# Patient Record
Sex: Female | Born: 1955 | Race: White | Hispanic: No | Marital: Married | Smoking: Former smoker
Health system: Southern US, Community
[De-identification: ages and names within clinical notes are randomized; demographics above are authoritative.]

## PROBLEM LIST (undated history)

## (undated) DIAGNOSIS — F419 Anxiety disorder, unspecified: Secondary | ICD-10-CM

## (undated) DIAGNOSIS — E039 Hypothyroidism, unspecified: Secondary | ICD-10-CM

## (undated) DIAGNOSIS — G43909 Migraine, unspecified, not intractable, without status migrainosus: Secondary | ICD-10-CM

## (undated) DIAGNOSIS — T4145XA Adverse effect of unspecified anesthetic, initial encounter: Secondary | ICD-10-CM

## (undated) DIAGNOSIS — B059 Measles without complication: Secondary | ICD-10-CM

## (undated) DIAGNOSIS — B019 Varicella without complication: Secondary | ICD-10-CM

## (undated) DIAGNOSIS — T8859XA Other complications of anesthesia, initial encounter: Secondary | ICD-10-CM

## (undated) DIAGNOSIS — Z9889 Other specified postprocedural states: Secondary | ICD-10-CM

## (undated) DIAGNOSIS — R112 Nausea with vomiting, unspecified: Secondary | ICD-10-CM

## (undated) HISTORY — PX: ABDOMINAL HYSTERECTOMY: SHX81

## (undated) HISTORY — PX: TONSILLECTOMY: SUR1361

## (undated) HISTORY — DX: Hypothyroidism, unspecified: E03.9

## (undated) HISTORY — DX: Anxiety disorder, unspecified: F41.9

---
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
## 2000-09-15 ENCOUNTER — Ambulatory Visit (HOSPITAL_COMMUNITY): Admission: RE | Admit: 2000-09-15 | Discharge: 2000-09-15 | Payer: Self-pay | Admitting: General Surgery

## 2001-03-07 ENCOUNTER — Encounter: Payer: Self-pay | Admitting: Obstetrics and Gynecology

## 2001-03-07 ENCOUNTER — Ambulatory Visit (HOSPITAL_COMMUNITY): Admission: RE | Admit: 2001-03-07 | Discharge: 2001-03-07 | Payer: Self-pay | Admitting: Obstetrics and Gynecology

## 2001-03-16 ENCOUNTER — Other Ambulatory Visit: Admission: RE | Admit: 2001-03-16 | Discharge: 2001-03-16 | Payer: Self-pay | Admitting: Obstetrics and Gynecology

## 2001-11-01 ENCOUNTER — Ambulatory Visit (HOSPITAL_COMMUNITY): Admission: RE | Admit: 2001-11-01 | Discharge: 2001-11-01 | Payer: Self-pay | Admitting: Internal Medicine

## 2002-08-12 ENCOUNTER — Inpatient Hospital Stay (HOSPITAL_COMMUNITY): Admission: RE | Admit: 2002-08-12 | Discharge: 2002-08-16 | Payer: Self-pay | Admitting: Obstetrics and Gynecology

## 2002-08-15 ENCOUNTER — Encounter: Payer: Self-pay | Admitting: Obstetrics and Gynecology

## 2002-08-19 ENCOUNTER — Encounter: Payer: Self-pay | Admitting: Obstetrics and Gynecology

## 2002-08-19 ENCOUNTER — Ambulatory Visit (HOSPITAL_COMMUNITY): Admission: RE | Admit: 2002-08-19 | Discharge: 2002-08-19 | Payer: Self-pay | Admitting: Obstetrics and Gynecology

## 2004-06-28 ENCOUNTER — Ambulatory Visit (HOSPITAL_COMMUNITY): Admission: RE | Admit: 2004-06-28 | Discharge: 2004-06-28 | Payer: Self-pay | Admitting: Obstetrics and Gynecology

## 2008-06-02 ENCOUNTER — Ambulatory Visit (HOSPITAL_COMMUNITY): Admission: RE | Admit: 2008-06-02 | Discharge: 2008-06-02 | Payer: Self-pay | Admitting: Obstetrics & Gynecology

## 2009-05-04 ENCOUNTER — Ambulatory Visit (HOSPITAL_COMMUNITY): Admission: RE | Admit: 2009-05-04 | Discharge: 2009-05-04 | Payer: Self-pay | Admitting: Obstetrics and Gynecology

## 2010-10-22 NOTE — Discharge Summary (Signed)
   NAME:  Lisa Fernandez, Lisa Fernandez                        ACCOUNT NO.:  [id number]   MEDICAL RECORD NO.:  [id number]                   PATIENT TYPE:  INP   LOCATION:  A427                                 FACILITY:  [facility]   PHYSICIAN:  Tilda Burrow, M.D.              DATE OF BIRTH:  1955/08/31   DATE OF ADMISSION:  08/12/2002  DATE OF DISCHARGE:  08/16/2002                                 DISCHARGE SUMMARY   ADMISSION DIAGNOSES:  1. Menorrhagia.  2. Recurrent, persistent ovarian cysts.   DISCHARGE DIAGNOSES:  1. Menorrhagia.  2. Recurrent, persistent ovarian cysts.   PROCEDURE:  Total abdominal hysterectomy and bilateral salpingo-  oophorectomy, 08/12/2002.   DISCHARGE MEDICATIONS:  1. Estratest one p.o. daily.  2. HCTZ 25 mg one p.o. daily.  3. K-Dur 20 mEq p.o. b.i.d. times two weeks.  4. Darvocet-N 100 one to two q.4h. p.r.n. pain.   HOSPITAL COURSE:  Patient was admitted and underwent hysterectomy in what  was considered an uneventful fashion on 08/12/2002.  Urinary output during  the evening of surgery was lower than anticipated, and patient required  fluid bolus on several occasions.  The patient had low blood pressures, was  seen by me at 1:50 a.m.  I did an ultrasound, and this revealed that she had  some suspected continued intra-abdominal bleeding.   Dictation ended at this point.                                               Tilda Burrow, M.D.    JVF/MEDQ  D:  10/14/2002  T:  10/15/2002  Job:  [id number]

## 2010-10-22 NOTE — Discharge Summary (Signed)
NAME:  Lisa Fernandez, Lisa Fernandez                        ACCOUNT NO.:  [id number]   MEDICAL RECORD NO.:  [id number]                   PATIENT TYPE:  INP   LOCATION:  A427                                 FACILITY:  [facility]   PHYSICIAN:  Tilda Burrow, M.D.              DATE OF BIRTH:  11-19-1955   DATE OF ADMISSION:  08/12/2002  DATE OF DISCHARGE:  08/16/2002                                 DISCHARGE SUMMARY   ADDENDUM:  As described earlier, before the tape cut off, we were evaluating  her for postoperative hypovolemia, and she was suspected to have an intra-  abdominal hematoma developing.  She was taken back to the OR at 5 a.m. on  08/13/2002, which revealed a postoperative seroma in the subcu tissues.  She  was re-explored that morning with findings consisting of 300 to 500 mL of  transudate in pelvis with generous small-bowel fluid; no evidence of tissue  injury.  She had 800 mL of bilious nasogastric thin drainage and a smooth  pelvic _________ without evidence of postoperative bleeding.  She had a  subcu J-P drain placed and nasogastric tube placed to intermittent suction.  Postoperatively, the patient had hypoactive bowel sounds, and, upon talking  to her further, she was taking lactulose at home the night after the  surgery.  She has a history of chronic laxative use.  It was felt that the  hypovolemia and decreased urinary output postoperatively were due to the  lactulose and its third spacing into the bowel, since lactulose acts as an  osmotic diuretic.  From that point, she had a more uneventful postoperative  course.  The hemoglobin was 9.5, hematocrit 27.6, white count normal at  6900.  She did well from 08/13/2002.  On 08/14/2002, she had a watery bowel  movement response to lactulose.  Temperature was 101, but she only had some  mild hypoactive bowel sounds postoperatively.  Third spacing continued.  On  08/15/2002, she had some decreased oxygen saturation overnight but  responded  to oxygen ________ and had deep inspiratory rales bilaterally.  She was  considered to have fluid overload and was treated with a diuretic.  On  08/15/2002, by that evening, she looked much better, and chest x-ray showed  right lower lobe infiltrate, suggesting hypoventilation.  She was given p.o.  Lasix and did well.  On 08/16/2002, postoperative day four from her original  surgery and postoperative day three from her exploration, she had lost 6  pounds, looked dramatically better, and was considered stable for discharge.   DISCHARGE DIAGNOSIS:  Chronic laxative use.                                               Tilda Burrow, M.D.    JVF/MEDQ  D:  10/14/2002  T:  10/15/2002  Job:  [id number]

## 2010-10-22 NOTE — Op Note (Signed)
   NAME:  Lisa Fernandez, Lisa Fernandez                        ACCOUNT NO.:  [id number]   MEDICAL RECORD NO.:  [id number]                   PATIENT TYPE:  PINP   LOCATION:                                       FACILITY:  [facility]   PHYSICIAN:  Tilda Burrow, M.D.              DATE OF BIRTH:  Dec 10, 1955   DATE OF PROCEDURE:  08/12/2002  DATE OF DISCHARGE:                                 OPERATIVE REPORT   PREOPERATIVE DIAGNOSIS:  Menorrhagia.   POSTOPERATIVE DIAGNOSIS:  Menorrhagia.   PROCEDURE:  Total abdominal hysterectomy with bilateral salpingo-  oophorectomy.   SURGEON:  Tilda Burrow, M.D.   ASSISTANT:  ___________.   ANESTHESIA:  General.   COMPLICATIONS:  None.   ESTIMATED BLOOD LOSS:  400 cc.   DESCRIPTION OF PROCEDURE:  The patient was taken to the operating room and  prepped and draped in the usual fashion for lower abdominal surgery.   The transverse lower abdominal incision was repeated, excising the old scar  tissue.  The abdomen was prepped and the bowel packed away.  The uterus was  grasped, elevated, and the round ligaments taken down on either side.  The  infundibulopelvic ligaments on either side were clamped, cut, and suture  ligated.  The uterine vessels were then skeletonized, then crossclamped with  curved Heaney clamps, transected with Kelly clamps in position to stop  backbleeding.  We then proceeded with 0 chromic suture ligature on each  side.  The bladder flap had been developed anteriorly.  We then proceeded  with taken down the uterine support structures, first the upper cardinal  ligaments using the straight Heaney clamp, knife dissection, and 0 chromic  suture ligature.  The uterine vessels had been clamped, cut, and suture  ligated previously.  The lower cardinal ligaments were then taken down using  clamping, cutting, and suture ligating these with 0 chromic.  The anterior  cervicovaginal fornix was then entered with a knife stab incision followed  by  circumferential amputation of the cervix off of the vaginal cuff.  Two  Aldridge stitches were placed at each vaginal angle to improve vaginal cuff  support and promote hemostasis.  The cuff was then closed using a series of  interrupted 0 chromic suture ligatures, with good tissue approximation.  The  patient tolerated the procedure well and went to the recovery room in good  condition.                                               Tilda Burrow, M.D.   JVF/MEDQ  D:  10/14/2002  T:  10/15/2002  Job:  [id number]

## 2010-10-22 NOTE — Op Note (Signed)
NAME:  Lisa Fernandez, Lisa Fernandez                        ACCOUNT NO.:  [id number]   MEDICAL RECORD NO.:  [id number]                   PATIENT TYPE:  INP   LOCATION:  A427                                 FACILITY:  [facility]   PHYSICIAN:  Tilda Burrow, M.D.              DATE OF BIRTH:  1956-03-11   DATE OF PROCEDURE:  08/12/2002  DATE OF DISCHARGE:                                 OPERATIVE REPORT   PREOPERATIVE DIAGNOSIS:  Menorrhagia.   POSTOPERATIVE DIAGNOSES:  Menorrhagia.   PROCEDURE:  Total abdominal hysterectomy and bilateral salpingo-  oophorectomy, wide excision.   SURGEON:  Tilda Burrow, M.D.   ASSISTANT:  __________.   ANESTHESIA:  General.   COMPLICATIONS:  None.   FINDINGS:  Extensive fibrosis and healed C-section scars, markedly  retroverted twice normal size uterus--estimated 140 g.  Relatively normal  ovaries with the exception of a small 4-cm cyst on the left ovary.   DESCRIPTION OF PROCEDURE:  The patient was taken to the operating room,  prepped and draped in the usual fashion for lower abdominal surgery with  vaginal prepping and Foley catheter in place.  The old cicatrix was excised  widely, removing a 20 cm long x 6 cm wide ellipse skin and underlying  fibrotic tissues down to the fascia, which was then opened transversally in  the method of Pfannenstiel.  The peritoneum was carefully opened, bowel  visibly normal to appearance, and bowel able to be elevated and packed away.  Balfour retractor was in place and we made particular effort to make sure  that the blades did not reach the level of the pelvic vessels or iliopsoas  muscles.  A dry gauze laparotomy tape was placed underneath the retractor  elevated above the skin, so that no pressure could occur.   We then proceeded to identify round ligaments on either side doubly  clamping, cutting, and suture ligating them and followed by isolation of the  infundibulopelvic ligaments on either side, confirming the  ureters as being  well out of harms way and then doubly clamping, cutting, and suture ligating  the IP ligaments.  The uterine vessels were then skeletonized on each side,  clamped with curved Heaney clamp, and transected and suture ligated.  The  upper and lower cardinal ligaments were similarly taken down and back to the  straight Heaney clamps, knife dissection, and 0 chromic suture ligature.  Upon reaching the level of the vaginal cuff, we then opened the cuff  anteriorly and circumscribed and amputated the cervix off the vaginal cuff.  Four Kocher clamps were used to hold the vaginal angle.  __________  stitch  was placed in each lateral vaginal angle.   The cuff was then closed in the midline with a series of interrupted figure-  of-eight sutures with good hemostasis achieved.  The patient then had  irrigation of the pelvis, point cautery as necessary to achieve adequate  hemostasis, and then loose oversewing of the pelvic cuff pulling the pelvic  peritoneum over it with good, smooth, nonstressed tension-free pelvic floor  obtained.  Laparotomy equipment was removed, pelvis having been irrigated  and found to have satisfactory hemostasis, and then the anterior peritoneum  closed using 2-0 chromic.  The fascia was closed using continuous 0 Vicryl  after trimming the upper edge of the fascia to make it even and remove the  band of fibrosis that was there on the edge.  We then closed the fascia as  described earlier, closed the subcutaneous tissues with interrupted 2-0  plain, and then placed a subcutaneous drain flat Jackson-Pratt 10-mm size in  the subcutaneous space and then placed staples in the skin with 300 mL  estimated blood loss.                                               Tilda Burrow, M.D.    JVF/MEDQ  D:  08/12/2002  T:  08/12/2002  Job:  [id number]

## 2010-10-22 NOTE — H&P (Signed)
NAME:  Lisa Fernandez, Lisa Fernandez NO.:  [id number]   MEDICAL RECORD NO.:  [id number]                   PATIENT TYPE:  INP   LOCATION:                                       FACILITY:  [facility]   PHYSICIAN:  Tilda Burrow, M.D.              DATE OF BIRTH:  02/03/1956   DATE OF ADMISSION:  08/12/2002  DATE OF DISCHARGE:                                HISTORY & PHYSICAL   ADMISSION DIAGNOSES:  1. Menorrhagia, failed medical therapy.  2. Recurrent, persistent ovarian cyst.   HISTORY OF PRESENT ILLNESS:  This 55 year old female, gravida 3, para 3,  status post three cesarean sections, with LMP 07/31/2002, is admitted at this  time for abdominal hysterectomy and bilateral salpingo-oophorectomy for  resolution of heavy menses which have not responded to efforts with birth  control pills (Mircette) as well as CombiPatch.  Ultrasound has shown her to  have a retroverted uterus, upper limits of normal size, with recurrent,  persistent ovarian cyst which has been symptomatic.  She had symptomatic  cysts 5.9 x 4.5 x 4.5 cm in the left ovary in November. Suppression with  Mircette resulted in slight improvement with maximum ovarian size 4.2 cm  post suppression.  The patient unfortunately has had persistent irregular  bleeding.  The ultrasound previously mentioned showed a normal uterus with  thin endometrial stripe but nonetheless, she failed to respond.  The patient  is sick and tired of the bleeding and desires surgical removal of the  uterus.  When not on birth control pills, she has had bleeding requiring  both tampons and pads with tampon change every 20 minutes generally on heavy  days of her cycle.   D&C was performed in 1995 for almost the exact same symptoms and only  resulted in temporary improvement.  The patient now wishes surgical removal  of the uterus.  After discussion of pros and cons with removal of tubes and  ovaries, she wishes to have removal of both  tubes and ovaries.  She has no  desire for persistent concerns over cysts and would rather take hormone  therapy if necessary.  The pros and cons of hormone therapy including  mention of the questionable danger and risks of breast cancer have been  discussed.   PAST MEDICAL HISTORY:  1. Positive for mild anxiety disorder.  2. Irritable bowel syndrome.  3. Degenerative disk with occasional discomfort in the buttocks right and     left  and right leg.   PAST SURGICAL HISTORY:  1. C section x 2.  2. Laparotomy in 1983.   HABITS:  Cigarettes and alcohol denied.   ALLERGIES:  TYLOX causes itching.   PHYSICAL EXAMINATION:  VITAL SIGNS:  Height 5 feet 7 inches, weight 189.  Blood pressure 125/70.  GENERAL:  Healthy-appearing Caucasian female, alert and oriented x 3.  HEENT:  Pupils are equal, round, and reactive.  Extraocular movements  intact.  NECK:  Supple.  Trachea midline.  CHEST: Clear to auscultation.  ABDOMEN:  Fibrotic, retracted Pfannenstiel type incision from three prior  surgeries.  Will require wide excision of this fibrotic scar as a part of  the procedure.  The patient is aware the incision will be much wider in  order to have cosmetically acceptable results.  PELVIC:  External genitalia normal.  Multiparous cervix.  Uterus  retroflexed, retroverted, having 20 g estimated weight.  Adnexa had small  cyst on the left at 4.2 cm in size.   PLAN:  Hysterectomy with removal of tubes and ovaries and wide excision of  cicatrix on 08/12/2002.                                               Tilda Burrow, M.D.    JVF/MEDQ  D:  08/09/2002  T:  08/09/2002  Job:  [id number]

## 2011-03-24 ENCOUNTER — Other Ambulatory Visit: Payer: Self-pay | Admitting: Obstetrics and Gynecology

## 2011-03-24 DIAGNOSIS — Z139 Encounter for screening, unspecified: Secondary | ICD-10-CM

## 2011-04-01 ENCOUNTER — Ambulatory Visit (HOSPITAL_COMMUNITY)
Admission: RE | Admit: 2011-04-01 | Discharge: 2011-04-01 | Disposition: A | Discharge status: Home / Self Care | Payer: BC Managed Care – PPO | Source: Ambulatory Visit | Attending: Obstetrics and Gynecology | Admitting: Obstetrics and Gynecology

## 2011-04-01 DIAGNOSIS — Z1231 Encounter for screening mammogram for malignant neoplasm of breast: Secondary | ICD-10-CM | POA: Insufficient documentation

## 2011-04-01 DIAGNOSIS — Z139 Encounter for screening, unspecified: Secondary | ICD-10-CM

## 2012-03-21 ENCOUNTER — Other Ambulatory Visit: Payer: Self-pay | Admitting: Obstetrics and Gynecology

## 2012-03-21 DIAGNOSIS — Z139 Encounter for screening, unspecified: Secondary | ICD-10-CM

## 2012-04-09 ENCOUNTER — Ambulatory Visit (HOSPITAL_COMMUNITY)
Admission: RE | Admit: 2012-04-09 | Discharge: 2012-04-09 | Disposition: A | Discharge status: Home / Self Care | Payer: BC Managed Care – PPO | Source: Ambulatory Visit | Attending: Obstetrics and Gynecology | Admitting: Obstetrics and Gynecology

## 2012-04-09 DIAGNOSIS — Z139 Encounter for screening, unspecified: Secondary | ICD-10-CM

## 2012-04-09 DIAGNOSIS — Z1231 Encounter for screening mammogram for malignant neoplasm of breast: Secondary | ICD-10-CM | POA: Insufficient documentation

## 2013-01-08 ENCOUNTER — Other Ambulatory Visit: Payer: Self-pay | Admitting: Obstetrics and Gynecology

## 2013-01-08 NOTE — Telephone Encounter (Signed)
Left message. JSY 

## 2013-01-09 NOTE — Telephone Encounter (Signed)
I authorized 6 months refil this moring at 4 am, didn't call pt ,of course due to time of day. plese advise pt she will need a brief visit for refil in 6 months

## 2013-01-09 NOTE — Telephone Encounter (Signed)
Xanax Rx put in EPIC and routed to Dr. Emelda Fear to address. Pt aware. To call back this afternoon if she hasn't heard from [place].

## 2013-01-09 NOTE — Telephone Encounter (Signed)
Spoke with pt. Advised Xanax had been refilled per Dr. Emelda Fear. Advised to schedule a visit within 6 months. Pt voiced understanding. JSY

## 2013-04-19 ENCOUNTER — Other Ambulatory Visit: Payer: Self-pay | Admitting: Obstetrics and Gynecology

## 2013-04-19 DIAGNOSIS — Z139 Encounter for screening, unspecified: Secondary | ICD-10-CM

## 2013-05-13 ENCOUNTER — Ambulatory Visit (HOSPITAL_COMMUNITY)
Admission: RE | Admit: 2013-05-13 | Discharge: 2013-05-13 | Disposition: A | Discharge status: Home / Self Care | Payer: BC Managed Care – PPO | Source: Ambulatory Visit | Attending: Obstetrics and Gynecology | Admitting: Obstetrics and Gynecology

## 2013-05-13 DIAGNOSIS — Z1231 Encounter for screening mammogram for malignant neoplasm of breast: Secondary | ICD-10-CM | POA: Insufficient documentation

## 2013-05-13 DIAGNOSIS — Z139 Encounter for screening, unspecified: Secondary | ICD-10-CM

## 2013-05-15 ENCOUNTER — Encounter: Payer: Self-pay | Admitting: Obstetrics and Gynecology

## 2013-05-15 ENCOUNTER — Ambulatory Visit (INDEPENDENT_AMBULATORY_CARE_PROVIDER_SITE_OTHER): Payer: BC Managed Care – PPO | Admitting: Obstetrics and Gynecology

## 2013-05-15 VITALS — BP 110/66 | Ht 67.0 in | Wt 183.0 lb

## 2013-05-15 DIAGNOSIS — Z01419 Encounter for gynecological examination (general) (routine) without abnormal findings: Secondary | ICD-10-CM

## 2013-05-15 DIAGNOSIS — Z0000 Encounter for general adult medical examination without abnormal findings: Secondary | ICD-10-CM

## 2013-05-15 MED ORDER — ALPRAZOLAM 0.5 MG PO TABS: 0.5000 mg | ORAL_TABLET | Freq: Two times a day (BID) | ORAL | Status: DC | PRN

## 2013-05-15 MED ORDER — ESTROGENS, CONJUGATED 0.625 MG/GM VA CREA: 1.0000 | TOPICAL_CREAM | VAGINAL | Status: DC

## 2013-05-15 NOTE — Progress Notes (Signed)
Patient ID: Lisa Fernandez, female   DOB: 01-06-1956, 57 y.o.   MRN: [id number]  Assessment:  Annual Gyn Exam   Plan:  No pap.  3    Annual mammogram advised Subjective:  Lisa Fernandez is a 57 y.o. female No obstetric history on file. who presents for annual exam. No LMP recorded. Patient has had a hysterectomy. The patient has complaints today of none   The following portions of the patient's history were reviewed and updated as appropriate: allergies, current medications, past family history, past medical history, past social history, past surgical history and problem list.  Review of Systems Constitutional: negative Gastrointestinal: negative Genitourinary:   Objective:  BP 110/66  Ht 5\' 7"  (1.702 m)  Wt 183 lb (83.008 kg)  BMI 28.66 kg/m2   BMI: Body mass index is 28.66 kg/(m^2).  General Appearance: Alert, appropriate appearance for age. No acute distress HEENT: Grossly normal Neck / Thyroid:  Cardiovascular: RRR; normal S1, S2, no murmur Lungs: CTA bilaterally Back: No CVAT Breast Exam: No dimpling, nipple retraction or discharge. No masses or nodes. and No masses or nodes.No dimpling, nipple retraction or discharge. Gastrointestinal: Soft, non-tender, no masses or organomegaly Pelvic Exam: Vulva and vagina appear normal. Bimanual exam reveals normal uterus and adnexa. Vaginal: normal mucosa without prolapse or lesions Adnexa: normal bimanual exam Uterus: absent and removed surgically Rectal: good sphincter tone Rectovaginal: normal rectal, no masses Lymphatic Exam: Non-palpable nodes in neck, clavicular, axillary, or inguinal regions Skin: no rash or abnormalities Neurologic: Normal gait and speech, no tremor  Psychiatric: Alert and oriented, appropriate affect.  Urinalysis:normal and Not done  Christin Bach. MD Pgr [phone] 3:56 PM

## 2013-12-04 ENCOUNTER — Other Ambulatory Visit: Payer: Self-pay | Admitting: Obstetrics and Gynecology

## 2013-12-05 ENCOUNTER — Telehealth: Payer: Self-pay | Admitting: Obstetrics and Gynecology

## 2013-12-05 ENCOUNTER — Other Ambulatory Visit: Payer: Self-pay | Admitting: *Deleted

## 2013-12-05 MED ORDER — ALPRAZOLAM 0.5 MG PO TABS: 0.5000 mg | ORAL_TABLET | Freq: Two times a day (BID) | ORAL | Status: DC | PRN

## 2013-12-05 NOTE — Telephone Encounter (Signed)
Spoke with pt letting her know we haven't received anything from [place]. Pt to call pharmacy and have them send a refill request for Xanax. JSY      We did receive a refill request from [facility] for Xanax and that will be entered into EPIC. [place]

## 2014-04-01 ENCOUNTER — Other Ambulatory Visit: Payer: Self-pay | Admitting: *Deleted

## 2014-04-01 MED ORDER — ALPRAZOLAM 0.5 MG PO TABS: 0.5000 mg | ORAL_TABLET | Freq: Two times a day (BID) | ORAL | Status: DC | PRN

## 2014-05-21 ENCOUNTER — Other Ambulatory Visit: Payer: Self-pay | Admitting: Obstetrics and Gynecology

## 2014-05-21 DIAGNOSIS — Z1231 Encounter for screening mammogram for malignant neoplasm of breast: Secondary | ICD-10-CM

## 2014-05-23 ENCOUNTER — Ambulatory Visit (INDEPENDENT_AMBULATORY_CARE_PROVIDER_SITE_OTHER): Payer: BC Managed Care – PPO | Admitting: Obstetrics and Gynecology

## 2014-05-23 ENCOUNTER — Encounter: Payer: Self-pay | Admitting: Obstetrics and Gynecology

## 2014-05-23 VITALS — BP 110/70 | Ht 68.0 in | Wt 190.0 lb

## 2014-05-23 DIAGNOSIS — Z1212 Encounter for screening for malignant neoplasm of rectum: Secondary | ICD-10-CM

## 2014-05-23 DIAGNOSIS — Z01419 Encounter for gynecological examination (general) (routine) without abnormal findings: Secondary | ICD-10-CM

## 2014-05-23 DIAGNOSIS — Z1211 Encounter for screening for malignant neoplasm of colon: Secondary | ICD-10-CM

## 2014-05-23 LAB — HEMOCCULT GUIAC POC 1CARD (OFFICE): FECAL OCCULT BLD: NEGATIVE

## 2014-05-23 NOTE — Progress Notes (Signed)
  Assessment:  Annual Gyn Exam   Plan:  1. pap smear done, next annual due yearly 2. return annually or prn 3    Annual mammogram advised Subjective:  Lisa Fernandez is a 58 y.o. female No obstetric history on file. who presents for annual exam. No LMP recorded. Patient has had a hysterectomy. She has not gotten her mammogram for the year yet because she has been working. She denies any associated symptoms. Since she has been placed on the synthroid medication she has been doing better. She had a colonoscopy completed three days ago. She just recently completed a hemoccult package.  The following portions of the patient's history were reviewed and updated as appropriate: allergies, current medications, past family history, past medical history, past social history, past surgical history and problem list. Past Medical History  Diagnosis Date  . Anxiety   . Hypothyroid     Past Surgical History  Procedure Laterality Date  . Abdominal hysterectomy    . Cesarean section      3    Current outpatient prescriptions: ALPRAZolam (XANAX) 0.5 MG tablet, Take 1 tablet (0.5 mg total) by mouth 2 (two) times daily as needed for anxiety., Disp: 60 tablet, Rfl: 3;  conjugated estrogens (PREMARIN) vaginal cream, Place 1 Applicatorful vaginally 2 (two) times a week., Disp: 42.5 g, Rfl: 2;  levothyroxine (SYNTHROID, LEVOTHROID) 50 MCG tablet, Take 50 mcg by mouth daily before breakfast., Disp: , Rfl:  rizatriptan (MAXALT) 5 MG tablet, Take 5 mg by mouth as needed for migraine. May repeat in 2 hours if needed, Disp: , Rfl:   Review of Systems Constitutional: negative Gastrointestinal: negative Genitourinary: negative  Objective:  BP 110/70 mmHg  Ht 5\' 8"  (1.727 m)  Wt 190 lb (86.183 kg)  BMI 28.90 kg/m2   BMI: Body mass index is 28.9 kg/(m^2).  General Appearance: Alert, appropriate appearance for age. No acute distress HEENT: Grossly normal Neck / Thyroid:  Cardiovascular: RRR; normal S1, S2, no  murmur Lungs: CTA bilaterally Back: No CVAT Breast Exam: No dimpling, nipple retraction or discharge. No masses or nodes., Normal to inspection and Normal breast tissue bilaterally, streak of tissue on left breast irregular nontender LUOQ tissue consistent with normal aging changes in tissue. Gastrointestinal: Soft, non-tender, no masses or organomegaly Pelvic Exam: External genitalia: normal general appearance Vaginal: normal mucosa without prolapse or lesions, normal without tenderness, induration or masses and normal rugae Cervix: removed surgically Adnexa: normal bimanual exam Uterus: removed surgically Rectovaginal: guaiac negative stool obtained Lymphatic Exam: Non-palpable nodes in neck, clavicular, axillary, or inguinal regions  Skin: no rash or abnormalities Neurologic: Normal gait and speech, no tremor  Psychiatric: Alert and oriented, appropriate affect.  Urinalysis:Not done  Assessment:  1. Annual Exam  Plan: 1. [facility] Rx refill  2 continue prn xanax lo dose, pt will notify us when rx needed   This chart was scribed for Lisa Kind, MD by Steva Colder, ED Scribe. The patient was seen in room 1 at 11:46 AM.

## 2014-06-02 ENCOUNTER — Ambulatory Visit (HOSPITAL_COMMUNITY)
Admission: RE | Admit: 2014-06-02 | Discharge: 2014-06-02 | Disposition: A | Discharge status: Home / Self Care | Payer: BC Managed Care – PPO | Source: Ambulatory Visit | Attending: Obstetrics and Gynecology | Admitting: Obstetrics and Gynecology

## 2014-06-02 DIAGNOSIS — Z1231 Encounter for screening mammogram for malignant neoplasm of breast: Secondary | ICD-10-CM | POA: Diagnosis present

## 2014-07-01 ENCOUNTER — Other Ambulatory Visit: Payer: Self-pay | Admitting: *Deleted

## 2014-07-03 NOTE — Telephone Encounter (Signed)
I think this came to me in error  She is a patient of Dr Johnnye Sima who just saw her last month so he can evaluate and decide whether to refill

## 2014-07-09 MED ORDER — ALPRAZOLAM 0.5 MG PO TABS: 0.5000 mg | ORAL_TABLET | Freq: Two times a day (BID) | ORAL | Status: DC | PRN

## 2014-07-09 NOTE — Addendum Note (Signed)
Addended by: [name] on: 07/09/2014 12:14 PM   Modules accepted: Orders

## 2014-11-21 ENCOUNTER — Other Ambulatory Visit: Payer: Self-pay | Admitting: Obstetrics and Gynecology

## 2014-11-24 ENCOUNTER — Other Ambulatory Visit: Payer: Self-pay | Admitting: *Deleted

## 2014-11-24 MED ORDER — ALPRAZOLAM 0.5 MG PO TABS: 0.5000 mg | ORAL_TABLET | Freq: Two times a day (BID) | ORAL | Status: DC | PRN

## 2014-11-26 ENCOUNTER — Telehealth: Payer: Self-pay | Admitting: Obstetrics and Gynecology

## 2014-11-26 ENCOUNTER — Other Ambulatory Visit: Payer: Self-pay | Admitting: Obstetrics and Gynecology

## 2014-11-26 DIAGNOSIS — F419 Anxiety disorder, unspecified: Secondary | ICD-10-CM

## 2014-11-26 MED ORDER — ALPRAZOLAM 0.5 MG PO TABS: 0.5000 mg | ORAL_TABLET | Freq: Two times a day (BID) | ORAL | Status: DC | PRN

## 2014-11-26 NOTE — Telephone Encounter (Signed)
I spoke with the pt and informed her that the Rx was approved and somehow didn't make it to the pharmacy. I advised the pt that I would get Dr. Glo Herring redo the Rx and to check back with the pharmacy after lunch. Pt verbalized understanding.

## 2015-01-14 ENCOUNTER — Other Ambulatory Visit: Payer: Self-pay | Admitting: Obstetrics and Gynecology

## 2015-01-20 ENCOUNTER — Telehealth: Payer: Self-pay | Admitting: *Deleted

## 2015-01-20 NOTE — Telephone Encounter (Signed)
Amy called from [facility] stating they did not receive pt RX for Xanax. Verbal order given for Xanax 0.5 mg tablet,  take one tablet po bid as needed for anxiety, #60, 2 refills per Epic.

## 2015-03-27 ENCOUNTER — Other Ambulatory Visit: Payer: Self-pay | Admitting: Obstetrics and Gynecology

## 2015-04-03 ENCOUNTER — Other Ambulatory Visit: Payer: Self-pay | Admitting: Obstetrics and Gynecology

## 2015-04-03 DIAGNOSIS — Z1231 Encounter for screening mammogram for malignant neoplasm of breast: Secondary | ICD-10-CM

## 2015-05-25 ENCOUNTER — Ambulatory Visit (INDEPENDENT_AMBULATORY_CARE_PROVIDER_SITE_OTHER): Payer: BLUE CROSS/BLUE SHIELD | Admitting: Obstetrics and Gynecology

## 2015-05-25 ENCOUNTER — Encounter: Payer: Self-pay | Admitting: Obstetrics and Gynecology

## 2015-05-25 VITALS — BP 112/74 | Ht 67.0 in | Wt 193.0 lb

## 2015-05-25 DIAGNOSIS — Z01419 Encounter for gynecological examination (general) (routine) without abnormal findings: Secondary | ICD-10-CM

## 2015-05-25 DIAGNOSIS — F419 Anxiety disorder, unspecified: Secondary | ICD-10-CM | POA: Insufficient documentation

## 2015-05-25 DIAGNOSIS — Z0000 Encounter for general adult medical examination without abnormal findings: Secondary | ICD-10-CM | POA: Insufficient documentation

## 2015-05-25 MED ORDER — ALPRAZOLAM 0.5 MG PO TABS: 0.5000 mg | ORAL_TABLET | Freq: Two times a day (BID) | ORAL | Status: DC | PRN

## 2015-05-25 NOTE — Progress Notes (Signed)
Patient ID: Lisa Fernandez, female   DOB: 10-Mar-1956, 59 y.o.   MRN: [id number] Pt here today for annual exam. Pt denies any problems or concerns at this time.

## 2015-05-25 NOTE — Progress Notes (Signed)
Patient ID: Lisa Fernandez, female   DOB: 09/19/1955, 59 y.o.   MRN: [id number]  Assessment:  Annual Gyn Exam   Plan:  1. pap smear not indicated s/p abdominal hysterectomy  2. return annually or prn 3    Annual mammogram advised Subjective:  Lisa Fernandez is a 59 y.o. female No obstetric history on file. who presents for annual exam. No LMP recorded. Patient has had a hysterectomy. The patient has no complaints. She reports her CBC, CMP and hemoccult from yesterday are normal. Pt has h/o abdominal hysterectomy, 3 cesarean sections. Pt reports she does self breast exams regularly and has not noticed any changes. FHx of breast cancer from grandmother. Pt has premarin that she uses intermittently for dyspareunia. She denies abdominal pain, constipation, diarrhea, melena, hematochezia.   The following portions of the patient's history were reviewed and updated as appropriate: allergies, current medications, past family history, past medical history, past social history, past surgical history and problem list. Past Medical History  Diagnosis Date  . Anxiety   . Hypothyroid     Past Surgical History  Procedure Laterality Date  . Abdominal hysterectomy    . Cesarean section      3     Current outpatient prescriptions:  .  ALPRAZolam (XANAX) 0.5 MG tablet, TAKE ONE TABLET BY MOUTH TWICE DAILY AS NEEDED FOR ANXIETY, Disp: 60 tablet, Rfl: 2 .  conjugated estrogens (PREMARIN) vaginal cream, Place 1 Applicatorful vaginally 2 (two) times a week., Disp: 42.5 g, Rfl: 2 .  levothyroxine (SYNTHROID, LEVOTHROID) 50 MCG tablet, Take 50 mcg by mouth daily before breakfast., Disp: , Rfl:  .  rizatriptan (MAXALT) 5 MG tablet, Take 5 mg by mouth as needed for migraine. May repeat in 2 hours if needed, Disp: , Rfl:   Review of Systems Constitutional: negative Gastrointestinal: negative Genitourinary: negative  Objective:  BP 112/74 mmHg  Ht 5\' 7"  (1.702 m)  Wt 193 lb (87.544 kg)  BMI 30.22 kg/m2   BMI:  Body mass index is 30.22 kg/(m^2).  General Appearance: Alert, appropriate appearance for age. No acute distress HEENT: Grossly normal Neck / Thyroid:  Cardiovascular: RRR; normal S1, S2, no murmur Lungs: CTA bilaterally Back: No CVAT Breast Exam: No masses or nodes.No dimpling, nipple retraction or discharge. Gastrointestinal: Soft, non-tender, no masses or organomegaly Pelvic Exam: External genitalia: normal general appearance Vaginal: normal mucosa without prolapse or lesions Cervix: absent and removed surgically Uterus: absent and removed surgically Rectovaginal: normal rectal tone; no masses, hemorrhoids or lesions. Guaiac negative.   Lymphatic Exam: Non-palpable nodes in neck, clavicular, axillary, or inguinal regions  Skin: no rash or abnormalities Neurologic: Normal gait and speech, no tremor  Psychiatric: Alert and oriented, appropriate affect.  Urinalysis:Not done  Hemoccult: negative   Mallory Shirk. MD Pgr [phone] 4:22 PM    By signing my name below, I, Hansel Feinstein, attest that this documentation has been prepared under the direction and in the presence of [name], MD. Electronically Signed: Hansel Feinstein, ED Scribe. 05/25/2015. 4:11 PM.  I personally performed the services described in this documentation, which was SCRIBED in my presence. The recorded information has been reviewed and considered accurate. It has been edited as necessary during review. [name], MD

## 2015-05-28 ENCOUNTER — Other Ambulatory Visit: Payer: Self-pay | Admitting: Obstetrics and Gynecology

## 2015-06-15 ENCOUNTER — Ambulatory Visit (HOSPITAL_COMMUNITY): Payer: Self-pay

## 2015-06-29 ENCOUNTER — Ambulatory Visit (HOSPITAL_COMMUNITY)
Admission: RE | Admit: 2015-06-29 | Discharge: 2015-06-29 | Disposition: A | Discharge status: Home / Self Care | Payer: BLUE CROSS/BLUE SHIELD | Source: Ambulatory Visit | Attending: Obstetrics and Gynecology | Admitting: Obstetrics and Gynecology

## 2015-06-29 DIAGNOSIS — Z1231 Encounter for screening mammogram for malignant neoplasm of breast: Secondary | ICD-10-CM

## 2015-09-23 ENCOUNTER — Other Ambulatory Visit: Payer: Self-pay | Admitting: Obstetrics and Gynecology

## 2015-09-23 NOTE — Telephone Encounter (Signed)
refil alprazolam x 60 tabs, refil x 1

## 2015-11-23 ENCOUNTER — Other Ambulatory Visit: Payer: Self-pay | Admitting: Obstetrics and Gynecology

## 2016-03-28 ENCOUNTER — Telehealth: Payer: Self-pay | Admitting: Obstetrics and Gynecology

## 2016-03-28 NOTE — Telephone Encounter (Signed)
Pt requesting refill on Xanax, states she will be in [place] on this Thursday, 03/31/2016, can pick up Rx for the Xanax at that time. Pt has an annual exam scheduled with Dr. Glo Herring for 05/27/2016.   Please advise.

## 2016-03-30 ENCOUNTER — Other Ambulatory Visit: Payer: Self-pay | Admitting: Obstetrics and Gynecology

## 2016-03-30 MED ORDER — ALPRAZOLAM 0.5 MG PO TABS: 0.5000 mg | ORAL_TABLET | Freq: Two times a day (BID) | ORAL | 2 refills | Status: DC | PRN

## 2016-03-30 NOTE — Telephone Encounter (Signed)
Spoke with Dr.Ferguson states he will print RX for Xanax and leave at the front desk so pt can pick up tomorrow 03/31/2016.

## 2016-05-27 ENCOUNTER — Encounter: Payer: Self-pay | Admitting: Obstetrics and Gynecology

## 2016-05-27 ENCOUNTER — Ambulatory Visit (INDEPENDENT_AMBULATORY_CARE_PROVIDER_SITE_OTHER): Payer: Managed Care, Other (non HMO) | Admitting: Obstetrics and Gynecology

## 2016-05-27 ENCOUNTER — Encounter (INDEPENDENT_AMBULATORY_CARE_PROVIDER_SITE_OTHER): Payer: Self-pay

## 2016-05-27 VITALS — BP 120/84 | HR 76 | Ht 67.0 in | Wt 198.0 lb

## 2016-05-27 DIAGNOSIS — Z01419 Encounter for gynecological examination (general) (routine) without abnormal findings: Secondary | ICD-10-CM

## 2016-05-27 NOTE — Progress Notes (Signed)
Assessment:  Annual Gyn Exam   Plan:  1. pap smear not indicated s/p abdominal hysterectomy  2. return annually or prn 3    Annual mammogram advised Subjective:  Lisa Fernandez is a 60 y.o. female with a h/o abdominal hysterectomy, 3 cesarean sections (no obstetric history on file), who presents for annual exam. No LMP recorded. The patient has no acute complaints at this time. She is currently on Premarin but uses it intermittently.   The following portions of the patient's history were reviewed and updated as appropriate: allergies, current medications, past family history, past medical history, past social history, past surgical history and problem list. Past Medical History:  Diagnosis Date  . Anxiety   . Hypothyroid     Past Surgical History:  Procedure Laterality Date  . ABDOMINAL HYSTERECTOMY    . CESAREAN SECTION     3     Current Outpatient Prescriptions:  .  ALPRAZolam (XANAX) 0.5 MG tablet, Take 1 tablet (0.5 mg total) by mouth 2 (two) times daily as needed. for anxiety, Disp: 60 tablet, Rfl: 2 .  levothyroxine (SYNTHROID, LEVOTHROID) 50 MCG tablet, Take 50 mcg by mouth daily before breakfast., Disp: , Rfl:  .  SUMAtriptan (IMITREX) 100 MG tablet, Take 100 mg by mouth daily as needed., Disp: , Rfl: 12 .  ALPRAZolam (XANAX) 0.5 MG tablet, TAKE ONE TABLET BY MOUTH TWICE DAILY AS NEEDED FOR ANXIETY (Patient not taking: Reported on 05/27/2016), Disp: 60 tablet, Rfl: 1 .  conjugated estrogens (PREMARIN) vaginal cream, Place 1 Applicatorful vaginally 2 (two) times a week. (Patient not taking: Reported on 05/27/2016), Disp: 42.5 g, Rfl: 2 .  rizatriptan (MAXALT) 5 MG tablet, Take 5 mg by mouth as needed for migraine. May repeat in 2 hours if needed, Disp: , Rfl:   Review of Systems Otherwise negative for acute change except as noted in the HPI.  Objective:  BP 120/84 (BP Location: Right Arm, Patient Position: Sitting, Cuff Size: Normal)   Pulse 76   Ht 5\' 7"  (1.702 m)   Wt  198 lb (89.8 kg)   BMI 31.01 kg/m    BMI: Body mass index is 31.01 kg/m.  General Appearance: Alert, appropriate appearance for age. No acute distress HEENT: Grossly normal Neck / Thyroid:  Cardiovascular: RRR; normal S1, S2, no murmur Lungs: CTA bilaterally Back: No CVAT Breast Exam: No dimpling, nipple retraction or discharge. No masses or nodes.  Gastrointestinal: Soft, non-tender, no masses or organomegaly Pelvic Exam: External genitalia: normal general appearance  Vaginal: normal mucosa without prolapse or lesions and normal without tenderness, induration or masses Cervix: absent and removed surgically Uterus: absent and removed surgically Rectal: good sphincter tone, good support; Tissue slightly atrophic. Guaiac negative Lymphatic Exam: Non-palpable nodes in neck, clavicular, axillary, or inguinal regions  Skin: no rash or abnormalities Neurologic: Normal gait and speech, no tremor  Psychiatric: Alert and oriented, appropriate affect.  Urinalysis:Not done   Discussion: 1. Discussed with pt risks accompanied with being overweight and benefits of weight loss  2. Discussed several techniques to aid in weightloss including: logging food, portion control, cutting down on sugar and increasing physical activity.  At end of discussion, pt had opportunity to ask questions and has no further questions at this time.   Specific discussion of weight loss as noted above. Greater than 50% was spent in counseling and coordination of care with the patient.   Total time greater than: 25 minutes. Over 50% in cousel and coordination of care.  By signing my name below, I, Evelene Croon, attest that this documentation has been prepared under the direction and in the presence of [name], MD . Electronically Signed: Evelene Croon, Scribe. 05/27/2016. 9:26 AM. I personally performed the services described in this documentation, which was SCRIBED in my presence. The recorded information  has been reviewed and considered accurate. It has been edited as necessary during review. [name], MD

## 2016-09-21 ENCOUNTER — Telehealth: Payer: Self-pay | Admitting: *Deleted

## 2016-09-21 MED ORDER — ALPRAZOLAM 0.5 MG PO TABS: 0.5000 mg | ORAL_TABLET | Freq: Two times a day (BID) | ORAL | 2 refills | Status: DC | PRN

## 2016-09-21 MED ORDER — ALPRAZOLAM 0.5 MG PO TABS: 0.5000 mg | ORAL_TABLET | Freq: Two times a day (BID) | ORAL | 1 refills | Status: DC | PRN

## 2016-09-24 NOTE — Telephone Encounter (Signed)
Has refill from last week

## 2016-12-13 ENCOUNTER — Other Ambulatory Visit: Payer: Self-pay | Admitting: Obstetrics and Gynecology

## 2016-12-19 NOTE — Telephone Encounter (Signed)
Pt to pick up Rx for alprazolam 0.5 hs used rarely

## 2017-02-08 ENCOUNTER — Other Ambulatory Visit: Payer: Self-pay | Admitting: Obstetrics and Gynecology

## 2017-02-09 NOTE — Telephone Encounter (Signed)
REFIL ALPARAZOLAM  0.5 MG X 60 TABS REFIL X 1

## 2017-03-29 ENCOUNTER — Other Ambulatory Visit: Payer: Self-pay | Admitting: Obstetrics and Gynecology

## 2017-04-10 ENCOUNTER — Other Ambulatory Visit: Payer: Self-pay | Admitting: *Deleted

## 2017-04-10 ENCOUNTER — Telehealth: Payer: Self-pay | Admitting: *Deleted

## 2017-04-10 MED ORDER — ALPRAZOLAM 0.5 MG PO TABS: 0.5000 mg | ORAL_TABLET | Freq: Two times a day (BID) | ORAL | 1 refills | Status: DC | PRN

## 2017-04-10 NOTE — Telephone Encounter (Signed)
Patient notified prescription refilled and faxed to [facility].

## 2017-05-02 NOTE — Telephone Encounter (Signed)
Refilled alprazolam x 6 months.

## 2017-05-31 ENCOUNTER — Ambulatory Visit (INDEPENDENT_AMBULATORY_CARE_PROVIDER_SITE_OTHER): Payer: Managed Care, Other (non HMO) | Admitting: Obstetrics and Gynecology

## 2017-05-31 ENCOUNTER — Other Ambulatory Visit: Payer: Self-pay

## 2017-05-31 ENCOUNTER — Encounter: Payer: Self-pay | Admitting: Obstetrics and Gynecology

## 2017-05-31 ENCOUNTER — Encounter (INDEPENDENT_AMBULATORY_CARE_PROVIDER_SITE_OTHER): Payer: Self-pay

## 2017-05-31 DIAGNOSIS — Z01411 Encounter for gynecological examination (general) (routine) with abnormal findings: Secondary | ICD-10-CM

## 2017-05-31 DIAGNOSIS — E039 Hypothyroidism, unspecified: Secondary | ICD-10-CM | POA: Insufficient documentation

## 2017-05-31 DIAGNOSIS — R195 Other fecal abnormalities: Secondary | ICD-10-CM

## 2017-05-31 DIAGNOSIS — E034 Atrophy of thyroid (acquired): Secondary | ICD-10-CM | POA: Diagnosis not present

## 2017-05-31 DIAGNOSIS — R635 Abnormal weight gain: Secondary | ICD-10-CM | POA: Diagnosis not present

## 2017-05-31 MED ORDER — ALPRAZOLAM 0.5 MG PO TABS: 0.5000 mg | ORAL_TABLET | Freq: Two times a day (BID) | ORAL | 2 refills | Status: DC | PRN

## 2017-05-31 NOTE — Progress Notes (Signed)
Assessment:  Annual Gyn Exam Hypothyroidism 4.470 TSH on Synthroid 50 mcg Decreased energy Positive fecal Hemoccult will refer Plan:  1. pap smear not done, no need for Paps status post hysterectomy 2. return annually or prn.  Return in 6 weeks for TSH and symptoms discussion 3    Annual mammogram advised after age 59 4.  Refer to Dr. Ladona Horns at [facility] gastroenterology, [facility] surgical specialist [address], [place] Subjective:  Lisa Fernandez is a 61 y.o. female G3P3 who presents for annual exam. No LMP recorded. Patient has had a hysterectomy. The patient has complaints today of decreased energy, has discussed TSH which is gone from 2.5-4.47 in a year and she feels decreased energy.  She is also gained 15 pounds in the past year after reducing her activity She had a symptomatic episode of dizziness while at the beach probably dehydration but change her attitude to her physical activity  The following portions of the patient's history were reviewed and updated as appropriate: allergies, current medications, past family history, past medical history, past social history, past surgical history and problem list. Past Medical History:  Diagnosis Date  . Anxiety   . Hypothyroid     Past Surgical History:  Procedure Laterality Date  . ABDOMINAL HYSTERECTOMY    . CESAREAN SECTION     3     Current Outpatient Medications:  .  ALPRAZolam (XANAX) 0.5 MG tablet, Take 1 tablet (0.5 mg total) 2 (two) times daily as needed by mouth. for anxiety, Disp: 60 tablet, Rfl: 1 .  levothyroxine (SYNTHROID, LEVOTHROID) 50 MCG tablet, Take 50 mcg by mouth daily before breakfast., Disp: , Rfl:  .  rizatriptan (MAXALT) 5 MG tablet, Take 5 mg by mouth as needed for migraine. May repeat in 2 hours if needed, Disp: , Rfl:  .  ALPRAZolam (XANAX) 0.5 MG tablet, Take 1 tablet (0.5 mg total) by mouth 2 (two) times daily as needed. for anxiety (Patient not taking: Reported on 05/31/2017), Disp: 60 tablet, Rfl: 2 .   ALPRAZolam (XANAX) 0.5 MG tablet, TAKE ONE TABLET BY MOUTH TWICE DAILY AS NEEDED FOR ANXIETY. (Patient not taking: Reported on 05/31/2017), Disp: 60 tablet, Rfl: 0 .  ALPRAZolam (XANAX) 0.5 MG tablet, TAKE ONE TABLET BY MOUTH TWICE DAILY AS NEEDED FOR ANXIETY. (Patient not taking: Reported on 05/31/2017), Disp: 60 tablet, Rfl: 5 .  conjugated estrogens (PREMARIN) vaginal cream, Place 1 Applicatorful vaginally 2 (two) times a week. (Patient not taking: Reported on 05/27/2016), Disp: 42.5 g, Rfl: 2  Review of Systems Constitutional: positive for Decreased energy, weight gain Gastrointestinal: negative has chronic constipation but takes stool softeners and keeps it under control she is not noticed any blood in her bowel movements last BM was this morning considered normal Genitourinary: Negative  Objective:  BP 116/72 (BP Location: Right Arm, Patient Position: Sitting, Cuff Size: Normal)   Pulse 79   Ht 5\' 7"  (1.702 m)   Wt 195 lb (88.5 kg)   BMI 30.54 kg/m    BMI: Body mass index is 30.54 kg/m.  General Appearance: Alert, appropriate appearance for age. No acute distress HEENT: Grossly normal Neck / Thyroid:  Cardiovascular: RRR; normal S1, S2, no murmur Lungs: CTA bilaterally Back: No CVAT Breast Exam: Getting mammograms, due 1   Gastrointestinal: Soft, non-tender, no masses or organomegaly Pelvic Exam: Vaginal: normal mucosa without prolapse or lesions Cervix: removed surgically Adnexa: removed surgically Rectal: good sphincter tone, no masses and Guaiac positive Will refer to Dr. Juliann Pulse Rectovaginal: not  indicated and guaiac positive stool obtained Lymphatic Exam: Non-palpable nodes in neck, clavicular, axillary, or inguinal regions Skin: no rash or abnormalities Neurologic: Normal gait and speech, no tremor  Psychiatric: Alert and oriented, appropriate affect.  Urinalysis:Not done  Mallory Shirk. MD Pgr [phone] 9:10 AM

## 2017-05-31 NOTE — Addendum Note (Signed)
Addended by: [name] on: 05/31/2017 09:20 AM   Modules accepted: Orders

## 2017-06-01 ENCOUNTER — Other Ambulatory Visit: Payer: Self-pay | Admitting: Obstetrics and Gynecology

## 2017-06-01 DIAGNOSIS — Z1231 Encounter for screening mammogram for malignant neoplasm of breast: Secondary | ICD-10-CM

## 2017-06-05 ENCOUNTER — Ambulatory Visit (HOSPITAL_COMMUNITY)
Admission: RE | Admit: 2017-06-05 | Discharge: 2017-06-05 | Disposition: A | Discharge status: Home / Self Care | Payer: Managed Care, Other (non HMO) | Source: Ambulatory Visit | Attending: Obstetrics and Gynecology | Admitting: Obstetrics and Gynecology

## 2017-06-05 DIAGNOSIS — Z1231 Encounter for screening mammogram for malignant neoplasm of breast: Secondary | ICD-10-CM | POA: Diagnosis not present

## 2017-06-09 ENCOUNTER — Telehealth: Payer: Self-pay | Admitting: *Deleted

## 2017-06-09 NOTE — Telephone Encounter (Signed)
Informed pt of normal mammogram per Dr Maretta Bees request.

## 2017-06-26 ENCOUNTER — Telehealth: Payer: Self-pay | Admitting: Obstetrics and Gynecology

## 2017-06-27 MED ORDER — LEVOTHYROXINE SODIUM 75 MCG PO TABS: 75.0000 ug | ORAL_TABLET | Freq: Every day | ORAL | 4 refills | Status: DC

## 2017-06-27 NOTE — Telephone Encounter (Signed)
Patient states dosage of Synthroid was increased to 67mcg at last visit and needs new prescription sent. Verbal order received and new prescription sent to pharmacy .

## 2017-07-15 ENCOUNTER — Other Ambulatory Visit: Payer: Self-pay | Admitting: Obstetrics and Gynecology

## 2017-07-16 LAB — TSH: TSH: 1.28 u[IU]/mL (ref 0.450–4.500)

## 2017-07-27 ENCOUNTER — Ambulatory Visit (INDEPENDENT_AMBULATORY_CARE_PROVIDER_SITE_OTHER): Payer: Managed Care, Other (non HMO) | Admitting: Obstetrics and Gynecology

## 2017-07-27 ENCOUNTER — Encounter: Payer: Self-pay | Admitting: Obstetrics and Gynecology

## 2017-07-27 VITALS — BP 128/80 | HR 96 | Wt 198.8 lb

## 2017-07-27 DIAGNOSIS — E039 Hypothyroidism, unspecified: Secondary | ICD-10-CM | POA: Diagnosis not present

## 2017-07-27 MED ORDER — LEVOTHYROXINE SODIUM 75 MCG PO TABS: 75.0000 ug | ORAL_TABLET | Freq: Every day | ORAL | 4 refills | Status: DC

## 2017-07-27 NOTE — Progress Notes (Signed)
Patient ID: Lisa Fernandez, female   DOB: 07-24-1955, 62 y.o.   MRN: [id number]   Lisa Fernandez Clinic Visit  @DATE @            Patient name: Lisa Fernandez MRN [id number]  Date of birth: 10-12-1955  CC & HPI:  Lisa Fernandez is a 62 y.o. female presenting today for a follow-up and discussion of her thyroid function labs. The patient reports that she feels better and that her energy levels have improved. She denies fever, chills or any other symptoms or complaints at this time.  Was increased to 75 mcg daily of Synthroid ROS:  ROS -fever -chills All systems are negative except as noted in the HPI and PMH.   Pertinent History Reviewed:   Reviewed: Significant for Abdominal Hysterectomy  Medical         Past Medical History:  Diagnosis Date  . Anxiety   . Hypothyroid                               Surgical Hx:    Past Surgical History:  Procedure Laterality Date  . ABDOMINAL HYSTERECTOMY    . CESAREAN SECTION     3   Medications: Reviewed & Updated - see associated section                       Current Outpatient Medications:  .  levothyroxine (SYNTHROID) 75 MCG tablet, Take 1 tablet (75 mcg total) by mouth daily before breakfast., Disp: 90 tablet, Rfl: 4 .  SUMAtriptan (IMITREX) 25 MG tablet, Take 25 mg by mouth every 2 (two) hours as needed for migraine. May repeat in 2 hours if headache persists or recurs., Disp: , Rfl:  .  ALPRAZolam (XANAX) 0.5 MG tablet, TAKE ONE TABLET BY MOUTH TWICE DAILY AS NEEDED FOR ANXIETY. (Patient not taking: Reported on 05/31/2017), Disp: 60 tablet, Rfl: 0 .  conjugated estrogens (PREMARIN) vaginal cream, Place 1 Applicatorful vaginally 2 (two) times a week. (Patient not taking: Reported on 05/27/2016), Disp: 42.5 g, Rfl: 2 .  rizatriptan (MAXALT) 5 MG tablet, Take 5 mg by mouth as needed for migraine. May repeat in 2 hours if needed, Disp: , Rfl:    Social History: Reviewed -  reports that she has quit smoking. Her smoking use included cigarettes. She has a  0.50 pack-year smoking history. she has never used smokeless tobacco.  Objective Findings:  Vitals: Blood pressure 128/80, pulse 96, weight 198 lb 12.8 oz (90.2 kg).  PHYSICAL EXAMINATION General appearance - alert, well appearing, and in no distress and oriented to person, place, and time Mental status - alert, oriented to person, place, and time, normal mood, behavior, speech, dress, motor activity, and thought processes, affect appropriate to mood   Assessment & Plan:   A:  1. Thyroid Function is normal, with improved sense of well-being  P:  1. Refill Rx Synthroid times 1 year 75 mcg daily patient to follow-up with her primary care in November 2. F/u PRN  By signing my name below, I, Margit Banda, attest that this documentation has been prepared under the direction and in the presence of [name], MD. Electronically Signed: Margit Banda, Medical Scribe. 07/27/17. 4:34 PM.  I personally performed the services described in this documentation, which was SCRIBED in my presence. The recorded information has been reviewed and considered accurate. It has been edited as necessary during review. Angelyn Punt  Glo Herring, MD

## 2017-11-08 ENCOUNTER — Telehealth: Payer: Self-pay | Admitting: Obstetrics and Gynecology

## 2017-11-08 ENCOUNTER — Other Ambulatory Visit: Payer: Self-pay | Admitting: Obstetrics and Gynecology

## 2017-11-08 MED ORDER — ALPRAZOLAM 0.5 MG PO TABS: 0.5000 mg | ORAL_TABLET | Freq: Two times a day (BID) | ORAL | 5 refills | Status: DC | PRN

## 2017-11-08 NOTE — Telephone Encounter (Signed)
Spoke with pt. Pt needs a refill on Xanax. Pt is completely out. Please advise. Thanks!! [place]

## 2017-11-08 NOTE — Progress Notes (Signed)
refil xanax x 5 months. Pt remains resistant to weaning of her long term use of xanax for chronic anxieties.

## 2017-11-08 NOTE — Telephone Encounter (Signed)
refil x 5 of xanax

## 2017-11-09 NOTE — Telephone Encounter (Signed)
COMPLETED TODAY WITH 2 REFIL

## 2017-12-13 ENCOUNTER — Ambulatory Visit (INDEPENDENT_AMBULATORY_CARE_PROVIDER_SITE_OTHER): Payer: Managed Care, Other (non HMO)

## 2017-12-13 ENCOUNTER — Ambulatory Visit (INDEPENDENT_AMBULATORY_CARE_PROVIDER_SITE_OTHER): Payer: Managed Care, Other (non HMO) | Admitting: Orthopaedic Surgery

## 2017-12-13 ENCOUNTER — Encounter (INDEPENDENT_AMBULATORY_CARE_PROVIDER_SITE_OTHER): Payer: Self-pay | Admitting: Orthopaedic Surgery

## 2017-12-13 VITALS — BP 105/70 | HR 76 | Ht 67.0 in | Wt 193.0 lb

## 2017-12-13 DIAGNOSIS — M25561 Pain in right knee: Secondary | ICD-10-CM

## 2017-12-13 DIAGNOSIS — G8929 Other chronic pain: Secondary | ICD-10-CM | POA: Diagnosis not present

## 2017-12-13 DIAGNOSIS — M25551 Pain in right hip: Secondary | ICD-10-CM

## 2017-12-13 MED ORDER — BUPIVACAINE HCL 0.5 % IJ SOLN
2.0000 mL | INTRAMUSCULAR | Status: AC | PRN
  Administered 2017-12-13: 2 mL via INTRA_ARTICULAR

## 2017-12-13 MED ORDER — LIDOCAINE HCL 1 % IJ SOLN
2.0000 mL | INTRAMUSCULAR | Status: AC | PRN
  Administered 2017-12-13: 2 mL

## 2017-12-13 MED ORDER — METHYLPREDNISOLONE ACETATE 40 MG/ML IJ SUSP
80.0000 mg | INTRAMUSCULAR | Status: AC | PRN
  Administered 2017-12-13: 80 mg

## 2017-12-13 NOTE — Progress Notes (Signed)
Office Visit Note   Patient: Lisa Fernandez           Date of Birth: 1955/06/22           MRN: [id number] Visit Date: 12/13/2017              Requested by: Rory Percy, MD [place], [place] [address] PCP: Rory Percy, MD   Assessment & Plan: Visit Diagnoses:  1. Pain in right hip   2. Chronic pain of right knee     Plan: Present pain is a combination of osteoarthritis right hip and right knee.  I will inject the knee along the lateral compartment where there is more arthritis and monitor response.  I suspect she will still have some discomfort in her groin and referred pain in her thigh related to the arthritis in her hip.  Would consider cortisone injection in her hip over the next 3 to 4 weeks  Follow-Up Instructions: Return in about 1 month (around 01/13/2018).   Orders:  Orders Placed This Encounter  Procedures  . Large Joint Inj: R knee  . XR Pelvis 1-2 Views  . XR KNEE 3 VIEW RIGHT   No orders of the defined types were placed in this encounter.     Procedures: Large Joint Inj: R knee on 12/13/2017 12:00 PM Indications: pain and diagnostic evaluation Details: 25 G 1.5 in needle, anteromedial approach  Arthrogram: No  Medications: 2 mL lidocaine 1 %; 2 mL bupivacaine 0.5 %; 80 mg methylPREDNISolone acetate 40 MG/ML Procedure, treatment alternatives, risks and benefits explained, specific risks discussed. Consent was given by the patient. Immediately prior to procedure a time out was called to verify the correct patient, procedure, equipment, support staff and site/side marked as required. Patient was prepped and draped in the usual sterile fashion.       Clinical Data: No additional findings.   Subjective: Chief Complaint  Patient presents with  . Follow-up    R HIP PAIN FEELS LIKE IT WILL GIVE AWAY AT TIMES GOING ON FOR 6 MO GETTNG WORSE OVER LAST MO. PAIN RADIATES DOWN TO KNEE. PT WEARS KNEE BRACE TO HELP, HAD INJECTION IN R KNEE SEVERAL YRS. AGO FROM  DR [facility]  Lisa Fernandez seen about 4 years ago for evaluation of right knee pain.  I injected the knee with cortisone and she notes that it made a big difference.  Recently she has had a recurrence of her knee pain but also has been experiencing some right groin pain with referred discomfort into her right thigh.  On occasion she has a feeling like her leg may give way and she is not sure if it is related to her knee or to her hip.  She is not had any injury or trauma.  She oftentimes feels like her right leg will "turn out".  She is had several episodes of her knee "catching" over the past 6 months.  She is been wearing a knee brace that seems to help.  She denies any back pain.  There is been no numbness or tingling.  HPI  Review of Systems  Constitutional: Negative for fatigue and fever.  HENT: Negative for ear pain.   Eyes: Negative for pain.  Respiratory: Negative for cough and shortness of breath.   Cardiovascular: Negative for leg swelling.  Gastrointestinal: Positive for constipation. Negative for diarrhea.  Genitourinary: Negative for difficulty urinating.  Musculoskeletal: Positive for back pain. Negative for neck pain.  Skin: Negative for rash.  Allergic/Immunologic: Negative for food allergies.  Neurological: Positive for weakness. Negative for numbness.  Hematological: Does not bruise/bleed easily.  Psychiatric/Behavioral: Positive for sleep disturbance.     Objective: Vital Signs: BP 105/70 (BP Location: Right Arm, Patient Position: Sitting, Cuff Size: Normal)   Pulse 76   Ht 5\' 7"  (1.702 m)   Wt 193 lb (87.5 kg)   BMI 30.23 kg/m   Physical Exam  Constitutional: She is oriented to person, place, and time. She appears well-developed and well-nourished.  HENT:  Mouth/Throat: Oropharynx is clear and moist.  Eyes: Pupils are equal, round, and reactive to light. EOM are normal.  Pulmonary/Chest: Effort normal.  Neurological: She is alert and oriented to person, place, and  time.  Skin: Skin is warm and dry.  Psychiatric: She has a normal mood and affect. Her behavior is normal.    Ortho Exam awake alert and oriented x3.  Comfortable sitting.  Raise negative.  No percussible tenderness of lumbar spine.  Definite discomfort in the area of her right groin with internal rotation of her right lower extremity.  That pain is referred along the anterior thigh to the knee.  No knee effusion.  Pain along the lateral compartment of her right knee.  Some patellar crepitation.  No effusion.  No instability.  Neurovascular exam intact.  Still edema.  Full knee extension and flexion over 100 degrees  Specialty Comments:  No specialty comments available.  Imaging: Xr Knee 3 View Right  Result Date: 12/13/2017 To the right knee retained in several projections standing.  There are mild degenerative changes in all 3 compartments but particularly laterally where there are peripheral osteophytes and subchondral sclerosis about the lateral femoral condyle and tibial plateau.  Joint space is still relatively well-maintained.  No ectopic calcification mild osteophyte formation about the patellofemoral joint with possible slight lateral tilt.  films are consistent with moderate osteoarthritis  Xr Pelvis 1-2 Views  Result Date: 12/13/2017 AP the pelvis demonstrates arthritic changes about the right hip.  There is prominence of the lateral superior femoral head that could cause impingement.  There is slight narrowing of the joints surface superior laterally with a small osteophyte about the lateral acetabulum.  The head is larger on the right than it is on the left subchondral cysts are identified.  Films are consistent with osteoarthritis    PMFS History: Patient Active Problem List   Diagnosis Date Noted  . Fecal occult blood test positive 05/31/2017  . Hypothyroidism 05/31/2017  . Annual physical exam 05/25/2015  . Anxiety 05/25/2015   Past Medical History:  Diagnosis Date  .  Anxiety   . Hypothyroid     Family History  Problem Relation Age of Onset  . Cancer Mother        bladder  . Emphysema Father   . COPD Father   . Heart attack Father   . Diabetes Sister   . Diabetes Maternal Uncle   . Cancer Paternal Aunt   . Cancer Paternal Uncle   . Heart disease Maternal Grandmother   . Diabetes Maternal Grandmother   . Heart disease Maternal Grandfather   . Kidney disease Paternal Grandmother   . Cancer Paternal Grandfather        brain   . Heart attack Paternal Uncle     Past Surgical History:  Procedure Laterality Date  . ABDOMINAL HYSTERECTOMY    . CESAREAN SECTION     3   Social History   Occupational History  . Not  on file  Tobacco Use  . Smoking status: Former Smoker    Packs/day: 0.25    Years: 2.00    Pack years: 0.50    Types: Cigarettes  . Smokeless tobacco: Never Used  . Tobacco comment: Pt states that she only smokes at work not any where else.   Substance and Sexual Activity  . Alcohol use: No  . Drug use: No  . Sexual activity: Yes    Birth control/protection: Surgical

## 2018-01-03 NOTE — Addendum Note (Signed)
Addended by: Deeann Dowse R on: 01/03/2018 04:08 PM   Modules accepted: Orders

## 2018-04-30 ENCOUNTER — Other Ambulatory Visit: Payer: Self-pay | Admitting: Obstetrics and Gynecology

## 2018-04-30 NOTE — Telephone Encounter (Signed)
Refilled x 60 tabs.

## 2018-05-31 ENCOUNTER — Other Ambulatory Visit: Payer: Self-pay | Admitting: Obstetrics and Gynecology

## 2018-05-31 NOTE — Telephone Encounter (Signed)
[id number] 0.5 mg x 60 tabs refil x2.

## 2018-06-28 ENCOUNTER — Encounter: Payer: Self-pay | Admitting: Obstetrics and Gynecology

## 2018-06-28 ENCOUNTER — Ambulatory Visit (INDEPENDENT_AMBULATORY_CARE_PROVIDER_SITE_OTHER): Payer: 59 | Admitting: Obstetrics and Gynecology

## 2018-06-28 VITALS — BP 117/80 | HR 87 | Ht 68.0 in | Wt 188.0 lb

## 2018-06-28 DIAGNOSIS — Z01419 Encounter for gynecological examination (general) (routine) without abnormal findings: Secondary | ICD-10-CM | POA: Diagnosis not present

## 2018-06-28 DIAGNOSIS — Z1211 Encounter for screening for malignant neoplasm of colon: Secondary | ICD-10-CM

## 2018-06-28 DIAGNOSIS — Z1212 Encounter for screening for malignant neoplasm of rectum: Secondary | ICD-10-CM

## 2018-06-28 DIAGNOSIS — Z0000 Encounter for general adult medical examination without abnormal findings: Secondary | ICD-10-CM

## 2018-06-28 LAB — HEMOCCULT GUIAC POC 1CARD (OFFICE): Fecal Occult Blood, POC: NEGATIVE

## 2018-06-28 MED ORDER — ESTROGENS, CONJUGATED 0.625 MG/GM VA CREA: 0.5000 g | TOPICAL_CREAM | VAGINAL | 2 refills | Status: DC

## 2018-06-28 MED ORDER — ALPRAZOLAM 0.5 MG PO TABS: 0.5000 mg | ORAL_TABLET | Freq: Two times a day (BID) | ORAL | 2 refills | Status: DC | PRN

## 2018-06-28 NOTE — Progress Notes (Signed)
.  Assessment:  Annual Gyn Exam, well woman status post hysterectomy Dedication review and refill ADHD managed by primary care Migraine headaches resolved while on Adderall Plan:  1. Continue Adderall as per primary care, reduce Xanax to 0.5 mg at bedtime 2. return annually or prn 3    Annual mammogram advised after age 63 patient due mammogram later this spring Subjective:  Lisa Fernandez is a 63 y.o. female G3P3 who presents for annual exam.and med refill. No LMP recorded. Patient has had a hysterectomy. The patient has complaints today of none she is really doing quite well.  Her sensation of ADHD has improved dramatically.  She has been tried on Adderall by her primary care and is doing very well with it.  She is also noticed that her bladder frequency is actually less a problem.  She is not using her Premarin vaginal cream.  Encouraged to use it twice a week.  She is sexually active without any discomfort  The following portions of the patient's history were reviewed and updated as appropriate: allergies, current medications, past family history, past medical history, past social history, past surgical history and problem list. Past Medical History:  Diagnosis Date  . Anxiety   . Hypothyroid    Family history: Husband is status post quadruple bypass in the past year and is doing well Past Surgical History:  Procedure Laterality Date  . ABDOMINAL HYSTERECTOMY    . CESAREAN SECTION     3     Current Outpatient Medications:  .  ALPRAZolam (XANAX) 0.5 MG tablet, TAKE ONE TABLET BY MOUTH TWICE DAILY AS NEEDED FOR ANXIETY, Disp: 60 tablet, Rfl: 2 .  amphetamine-dextroamphetamine (ADDERALL XR) 20 MG 24 hr capsule, Take 20 mg by mouth daily., Disp: , Rfl:  .  levothyroxine (SYNTHROID) 75 MCG tablet, Take 1 tablet (75 mcg total) by mouth daily before breakfast., Disp: 90 tablet, Rfl: 4 .  SUMAtriptan (IMITREX) 25 MG tablet, Take 25 mg by mouth every 2 (two) hours as needed for migraine. May  repeat in 2 hours if headache persists or recurs., Disp: , Rfl:   Review of Systems Constitutional: Patient is absolutely feeling great and doing well Gastrointestinal: negative use a stool softener daily Genitourinary: Less urinary frequency since going on Adderall  Objective:  BP 117/80 (BP Location: Right Arm, Patient Position: Sitting, Cuff Size: Normal)   Pulse 87   Ht 5\' 8"  (1.727 m)   Wt 188 lb (85.3 kg)   BMI 28.59 kg/m    BMI: Body mass index is 28.59 kg/m.  General Appearance: Alert, appropriate appearance for age. No acute distress HEENT: Grossly normal Neck / Thyroid:  Cardiovascular: RRR; normal S1, S2, no murmur Lungs: CTA bilaterally Back: No CVAT Breast Exam: No dimpling, nipple retraction or discharge. No masses or nodes., Normal to inspection, Normal breast tissue bilaterally and No masses or nodes.No dimpling, nipple retraction or discharge. Gastrointestinal: Soft, non-tender, no masses or organomegaly Pelvic Exam: External genitalia: normal general appearance Vaginal: normal mucosa without prolapse or lesions, vaginal vault, Well supported, vaginal tissues are atrophic but nontender and Discussed hormone replacement.  Patient will except refill and use as needed Cervix: removed surgically Adnexa: normal bimanual exam Rectovaginal: normal rectal, no masses and guaiac negative stool obtained Lymphatic Exam: Non-palpable nodes in neck, clavicular, axillary, or inguinal regions Skin: no rash or abnormalities Neurologic: Normal gait and speech, no tremor  Psychiatric: Alert and oriented, appropriate affect.  Urinalysis:Not done  Mallory Shirk. MD Pgr [phone] 8:40 AM

## 2018-06-28 NOTE — Patient Instructions (Signed)
Atrophic Vaginitis    Atrophic vaginitis is a condition in which the tissues that line the vagina become dry and thin. This condition is most common in women who have stopped having regular menstrual periods (are in menopause). This usually starts when a woman is 45-61 years old. That is the time when a woman's estrogen levels begin to drop (decrease).  Estrogen is a female hormone. It helps to keep the tissues of the vagina moist. It stimulates the vagina to produce a clear fluid that lubricates the vagina for sexual intercourse. This fluid also protects the vagina from infection. Lack of estrogen can cause the lining of the vagina to get thinner and dryer. The vagina may also shrink in size. It may become less elastic. Atrophic vaginitis tends to get worse over time as a woman's estrogen level drops.  What are the causes?  This condition is caused by the normal drop in estrogen that happens around the time of menopause.  What increases the risk?  Certain conditions or situations may lower a woman's estrogen level, leading to a higher risk for atrophic vaginitis. You are more likely to develop this condition if:   You are taking medicines that block estrogen.   You have had your ovaries removed.   You are being treated for cancer with X-ray (radiation) or medicines (chemotherapy).   You have given birth or are breastfeeding.   You are older than age 50.   You smoke.  What are the signs or symptoms?  Symptoms of this condition include:   Pain, soreness, or bleeding during sexual intercourse (dyspareunia).   Vaginal burning, irritation, or itching.   Pain or bleeding when a speculum is used in a vaginal exam (pelvic exam).   Having burning pain when passing urine.   Vaginal discharge that is brown or yellow.  In some cases, there are no symptoms.  How is this diagnosed?  This condition is diagnosed by taking a medical history and doing a physical exam. This will include a pelvic exam that checks the  vaginal tissues. Though rare, you may also have other tests, including:   A urine test.   A test that checks the acid balance in your vagina (acid balance test).  How is this treated?  Treatment for this condition depends on how severe your symptoms are. Treatment may include:   Using an over-the-counter vaginal lubricant before sex.   Using a long-acting vaginal moisturizer.   Using low-dose vaginal estrogen for moderate to severe symptoms that do not respond to other treatments. Options include creams, tablets, and inserts (vaginal rings). Before you use a vaginal estrogen, tell your health care provider if you have a history of:  ? Breast cancer.  ? Endometrial cancer.  ? Blood clots.  If you are not sexually active and your symptoms are very mild, you may not need treatment.  Follow these instructions at home:  Medicines   Take over-the-counter and prescription medicines only as told by your health care provider. Do not use herbal or alternative medicines unless your health care provider says that you can.   Use over-the-counter creams, lubricants, or moisturizers for dryness only as directed by your health care provider.  General instructions   If your atrophic vaginitis is caused by menopause, discuss all of your menopause symptoms and treatment options with your health care provider.   Do not douche.   Do not use products that can make your vagina dry. These include:  ? Scented feminine sprays.  ?   Scented tampons.  ? Scented soaps.   Vaginal intercourse can help to improve blood flow and elasticity of vaginal tissue. If it hurts to have sex, try using a lubricant or moisturizer just before having intercourse.  Contact a health care provider if:   Your discharge looks different than normal.   Your vagina has an unusual smell.   You have new symptoms.   Your symptoms do not improve with treatment.   Your symptoms get worse.  Summary   Atrophic vaginitis is a condition in which the tissues that  line the vagina become dry and thin. It is most common in women who have stopped having regular menstrual periods (are in menopause).   Treatment options include using vaginal lubricants and low-dose vaginal estrogen.   Contact a health care provider if your vagina has an unusual smell, or if your symptoms get worse or do not improve after treatment.  This information is not intended to replace advice given to you by your health care provider. Make sure you discuss any questions you have with your health care provider.  Document Released: 10/07/2014 Document Revised: 02/16/2017 Document Reviewed: 02/16/2017  Elsevier Interactive Patient Education  2019 Elsevier Inc.

## 2018-06-28 NOTE — Addendum Note (Signed)
Addended by: Linton Rump on: 06/28/2018 09:46 AM   Modules accepted: Orders

## 2018-07-04 ENCOUNTER — Telehealth: Payer: Self-pay | Admitting: Obstetrics and Gynecology

## 2018-07-04 NOTE — Telephone Encounter (Signed)
Patient called stating that she was told by Dr. Glo Herring to try and slowly come off of her Xanax and let him know. Patient states that it did not work, she tried it for three days and could not sleep. Patient states that she will try again this weekend but she is sure that it is not going to work. Patient does not need a call back.

## 2018-08-08 NOTE — Telephone Encounter (Signed)
Note sent to nurse. 

## 2018-09-07 ENCOUNTER — Other Ambulatory Visit: Payer: Self-pay | Admitting: Obstetrics and Gynecology

## 2018-09-13 ENCOUNTER — Other Ambulatory Visit: Payer: Self-pay | Admitting: Obstetrics and Gynecology

## 2018-09-18 ENCOUNTER — Telehealth: Payer: Self-pay | Admitting: Obstetrics and Gynecology

## 2018-09-18 NOTE — Telephone Encounter (Signed)
Patient called, stated that [facility] has requested refills for her twice and nothing has been called in.  She needs a refill on Synthroid and Xanax.  [facility]  [phone]

## 2018-09-18 NOTE — Telephone Encounter (Signed)
Requesting refill for Levothyroxine.

## 2018-09-19 ENCOUNTER — Other Ambulatory Visit: Payer: Self-pay | Admitting: Obstetrics and Gynecology

## 2018-09-19 NOTE — Telephone Encounter (Signed)
I have reviewed my inbasket thoroughly and there are no refill requests from [facility]. We need patient refill requests to go thru the pharmacy. Please call [facility] and lets see if it can be determined where the error in prescribing is occurring.

## 2018-12-10 ENCOUNTER — Other Ambulatory Visit: Payer: Self-pay | Admitting: Obstetrics and Gynecology

## 2018-12-11 NOTE — Telephone Encounter (Signed)
refil xanax  0.5 . Being taken once daily

## 2018-12-25 ENCOUNTER — Other Ambulatory Visit: Payer: Managed Care, Other (non HMO)

## 2018-12-25 ENCOUNTER — Other Ambulatory Visit: Payer: Self-pay

## 2018-12-25 DIAGNOSIS — Z20822 Contact with and (suspected) exposure to covid-19: Secondary | ICD-10-CM

## 2018-12-27 LAB — NOVEL CORONAVIRUS, NAA: SARS-CoV-2, NAA: NOT DETECTED

## 2019-02-12 ENCOUNTER — Ambulatory Visit (INDEPENDENT_AMBULATORY_CARE_PROVIDER_SITE_OTHER): Payer: 59 | Admitting: General Surgery

## 2019-02-12 ENCOUNTER — Encounter

## 2019-02-12 ENCOUNTER — Other Ambulatory Visit: Payer: Self-pay

## 2019-02-12 ENCOUNTER — Encounter: Payer: Self-pay | Admitting: General Surgery

## 2019-02-12 VITALS — BP 116/77 | HR 73 | Temp 96.4°F | Resp 16 | Ht 67.0 in | Wt 161.0 lb

## 2019-02-12 DIAGNOSIS — K602 Anal fissure, unspecified: Secondary | ICD-10-CM

## 2019-02-12 NOTE — Patient Instructions (Signed)
Anal Fissure, Adult  An anal fissure is a small tear or crack in the tissue of the anus. Bleeding from a fissure usually stops on its own within a few minutes. However, bleeding will often occur again with each bowel movement until the fissure heals. What are the causes? This condition is usually caused by passing a large or hard stool (feces). Other causes include:  Constipation.  Frequent diarrhea.  Inflammatory bowel disease (Crohn's disease or ulcerative colitis).  Childbirth.  Infections.  Anal sex. What are the signs or symptoms? Symptoms of this condition include:  Bleeding from the rectum.  Small amounts of blood seen on your stool, on the toilet paper, or in the toilet after a bowel movement. The blood coats the outside of the stool and is not mixed with the stool.  Painful bowel movements.  Itching or irritation around the anus. How is this diagnosed? A health care provider may diagnose this condition by closely examining the anal area. An anal fissure can usually be seen with careful inspection. In some cases, a rectal exam may be performed, or a short tube (anoscope) may be used to examine the anal canal. How is this treated? Initial treatment for this condition may include:  Taking steps to avoid constipation. This may include making changes to your diet, such as increasing your intake of fiber or fluid.  Taking fiber supplements. These supplements can soften your stool to help make bowel movements easier. Your health care provider may also prescribe a stool softener if your stool is hard.  Taking sitz baths. This may help to heal the tear.  Using medicated creams or ointments. These may be prescribed to lessen discomfort. Treatments that are sometimes used if initial treatments do not work well or if the condition is more severe may include:  Botulinum injection.  Surgery to repair the fissure. Follow these instructions at home: Eating and drinking    Avoid foods that may cause constipation, such as bananas, milk, and other dairy products.  Eat all fruits, except bananas.  Drink enough fluid to keep your urine pale yellow.  Eat foods that are high in fiber, such as beans, whole grains, and fresh fruits and vegetables. General instructions   Take over-the-counter and prescription medicines only as told by your health care provider.  Use creams or ointments only as told by your health care provider.  Keep the anal area clean and dry.  Take sitz baths as told by your health care provider. Do not use soap in the sitz baths.  Keep all follow-up visits as told by your health care provider. This is important. Contact a health care provider if you have:  More bleeding.  A fever.  Diarrhea that is mixed with blood.  Pain that continues.  Ongoing problems that are getting worse rather than better. Summary  An anal fissure is a small tear or crack in the tissue of the anus. This condition is usually caused by passing a large or hard stool (feces). Other causes include constipation and frequent diarrhea.  Initial treatment for this condition may include taking steps to avoid constipation, such as increasing your intake of fiber or fluid.  Follow instructions for care as told by your health care provider.  Contact your health care provider if you have more bleeding or your problem is getting worse rather than better.  Keep all follow-up visits as told by your health care provider. This is important. This information is not intended to replace advice given   to you by your health care provider. Make sure you discuss any questions you have with your health care provider. Document Released: 05/23/2005 Document Revised: 11/02/2017 Document Reviewed: 11/02/2017 Elsevier Patient Education  2020 Elsevier Inc.  

## 2019-02-13 NOTE — Progress Notes (Signed)
Lisa Fernandez; [id number]; Sep 22, 1955   HPI Patient is a 63 year old white female who was referred to my care by Dr. Gar Ponto for evaluation treatment of rectal pain.  Patient states she has had a pressure sensation in her rectum for several months.  It was initially thought she had hemorrhoidal disease.  She was put on creams and suppositories, but could not tolerate the suppositories due to significant pain while placing the suppository.  Patient notices severe pain with defecation which radiates to her tailbone.  Her current pain level is 5 out of 10.  She notices some blood on the toilet paper when she wipes herself.  No active bleeding noted.  She is tried to vary her diet and add Metamucil, but that causes her to have more significant frequent bowel movements.  She is status post a hemorrhoidectomy by myself 18 years ago.  Patient states that she has to strain to move her bowels as it seems like her rectum tightens up. Past Medical History:  Diagnosis Date  . Anxiety   . Hypothyroid     Past Surgical History:  Procedure Laterality Date  . ABDOMINAL HYSTERECTOMY    . CESAREAN SECTION     3    Family History  Problem Relation Age of Onset  . Cancer Mother        bladder  . Emphysema Father   . COPD Father   . Heart attack Father   . Diabetes Sister   . Diabetes Maternal Uncle   . Cancer Paternal Aunt   . Cancer Paternal Uncle   . Heart disease Maternal Grandmother   . Diabetes Maternal Grandmother   . Heart disease Maternal Grandfather   . Kidney disease Paternal Grandmother   . Cancer Paternal Grandfather        brain   . Heart attack Paternal Uncle     Current Outpatient Medications on File Prior to Visit  Medication Sig Dispense Refill  . ALPRAZolam (XANAX) 0.5 MG tablet TAKE ONE TABLET BY MOUTH TWICE DAILY AS NEEDED FOR ANXIETY. 60 tablet 2  . amphetamine-dextroamphetamine (ADDERALL XR) 15 MG 24 hr capsule Take by mouth daily.    Marland Kitchen amphetamine-dextroamphetamine  (ADDERALL XR) 20 MG 24 hr capsule Take 20 mg by mouth daily.    Marland Kitchen levothyroxine (SYNTHROID) 75 MCG tablet TAKE ONE TABLET BY MOUTH DAILY BEFORE BREAKFAST 90 tablet 4  . SUMAtriptan (IMITREX) 100 MG tablet Take 100 mg by mouth daily as needed.    . SUMAtriptan (IMITREX) 25 MG tablet Take 25 mg by mouth every 2 (two) hours as needed for migraine. May repeat in 2 hours if headache persists or recurs.    . conjugated estrogens (PREMARIN) vaginal cream Place [id number] Applicatorfuls vaginally 2 (two) times a week. (Patient not taking: Reported on 02/12/2019) 42.5 g 2   No current facility-administered medications on file prior to visit.     Allergies  Allergen Reactions  . Tylox [Oxycodone-Acetaminophen]     " Makes me feel like bugs a re crawling on me."    Social History   Substance and Sexual Activity  Alcohol Use No    Social History   Tobacco Use  Smoking Status Former Smoker  . Packs/day: 0.25  . Years: 2.00  . Pack years: 0.50  . Types: Cigarettes  Smokeless Tobacco Never Used  Tobacco Comment   Pt states that she only smokes at work not any where else.     Review of Systems  Constitutional: Negative.  HENT: Negative.   Eyes: Negative.   Respiratory: Negative.   Cardiovascular: Negative.   Gastrointestinal: Negative.   Genitourinary: Negative.   Musculoskeletal: Negative.   Skin: Negative.   Neurological: Negative.   Endo/Heme/Allergies: Negative.   Psychiatric/Behavioral: Negative.     Objective   Vitals:   02/12/19 1517  BP: 116/77  Pulse: 73  Resp: 16  Temp: (!) 96.4 F (35.8 C)  SpO2: 99%    Physical Exam Vitals signs reviewed.  Constitutional:      Appearance: Normal appearance. She is not ill-appearing.  HENT:     Head: Normocephalic and atraumatic.  Cardiovascular:     Rate and Rhythm: Normal rate and regular rhythm.     Heart sounds: Normal heart sounds. No murmur. No friction rub. No gallop.   Pulmonary:     Effort: Pulmonary effort is  normal. No respiratory distress.     Breath sounds: Normal breath sounds. No stridor. No wheezing, rhonchi or rales.  Genitourinary:    Comments: Examination limited secondary to pain.  A posterior anal fissure was noted.  No significant external hemorrhoidal disease was noted.  No active bleeding was noted.  Sphincter tone was tight secondary to pain. Skin:    General: Skin is warm and dry.  Neurological:     Mental Status: She is alert and oriented to person, place, and time.     Assessment  Anal fissure Plan   We will try Rectiv cream to relieve the pain.  Patient also told to adjust her laxatives and fiber by using FiberCon or Citrucel.  We will see her again in 1 month for follow-up.  May need anal fissurectomy with or without internal lateral sphincterotomy.  Patient was told to try to relax while defecating.

## 2019-02-14 ENCOUNTER — Ambulatory Visit: Payer: 59 | Admitting: General Surgery

## 2019-02-26 ENCOUNTER — Ambulatory Visit: Payer: 59 | Admitting: General Surgery

## 2019-03-07 ENCOUNTER — Other Ambulatory Visit: Payer: Self-pay

## 2019-03-07 ENCOUNTER — Ambulatory Visit (INDEPENDENT_AMBULATORY_CARE_PROVIDER_SITE_OTHER): Payer: 59 | Admitting: General Surgery

## 2019-03-07 ENCOUNTER — Encounter: Payer: Self-pay | Admitting: General Surgery

## 2019-03-07 VITALS — BP 114/78 | HR 98 | Temp 96.8°F | Resp 16 | Ht 67.0 in | Wt 159.0 lb

## 2019-03-07 DIAGNOSIS — K602 Anal fissure, unspecified: Secondary | ICD-10-CM | POA: Diagnosis not present

## 2019-03-08 ENCOUNTER — Encounter (HOSPITAL_COMMUNITY): Payer: Self-pay

## 2019-03-08 ENCOUNTER — Other Ambulatory Visit (HOSPITAL_COMMUNITY)
Admission: RE | Admit: 2019-03-08 | Discharge: 2019-03-08 | Disposition: A | Discharge status: Home / Self Care | Payer: 59 | Source: Ambulatory Visit | Attending: General Surgery | Admitting: General Surgery

## 2019-03-08 ENCOUNTER — Encounter (HOSPITAL_COMMUNITY)
Admission: RE | Admit: 2019-03-08 | Discharge: 2019-03-08 | Disposition: A | Discharge status: Home / Self Care | Payer: 59 | Source: Ambulatory Visit | Attending: General Surgery | Admitting: General Surgery

## 2019-03-08 DIAGNOSIS — Z20828 Contact with and (suspected) exposure to other viral communicable diseases: Secondary | ICD-10-CM | POA: Diagnosis not present

## 2019-03-08 DIAGNOSIS — Z01812 Encounter for preprocedural laboratory examination: Secondary | ICD-10-CM | POA: Diagnosis not present

## 2019-03-08 DIAGNOSIS — K602 Anal fissure, unspecified: Secondary | ICD-10-CM | POA: Diagnosis not present

## 2019-03-08 HISTORY — DX: Other complications of anesthesia, initial encounter: T88.59XA

## 2019-03-08 HISTORY — DX: Other specified postprocedural states: R11.2

## 2019-03-08 HISTORY — DX: Other specified postprocedural states: Z98.890

## 2019-03-08 LAB — SARS CORONAVIRUS 2 (TAT 6-24 HRS): SARS Coronavirus 2: NEGATIVE

## 2019-03-08 NOTE — H&P (Signed)
Lisa Fernandez; [id number]; 1956/04/17   HPI Patient is a 63 year old white female who was referred to my care by Dr. Gar Ponto for evaluation treatment of rectal pain.  Patient states she has had a pressure sensation in her rectum for several months.  It was initially thought she had hemorrhoidal disease.  She was put on creams and suppositories, but could not tolerate the suppositories due to significant pain while placing the suppository.  Patient notices severe pain with defecation which radiates to her tailbone.  Her current pain level is 5 out of 10.  She notices some blood on the toilet paper when she wipes herself.  No active bleeding noted.  She is tried to vary her diet and add Metamucil, but that causes her to have more significant frequent bowel movements.  She is status post a hemorrhoidectomy by myself 18 years ago.  Patient states that she has to strain to move her bowels as it seems like her rectum tightens up. Past Medical History:  Diagnosis Date  . Anxiety   . Hypothyroid     Past Surgical History:  Procedure Laterality Date  . ABDOMINAL HYSTERECTOMY    . CESAREAN SECTION     3    Family History  Problem Relation Age of Onset  . Cancer Mother        bladder  . Emphysema Father   . COPD Father   . Heart attack Father   . Diabetes Sister   . Diabetes Maternal Uncle   . Cancer Paternal Aunt   . Cancer Paternal Uncle   . Heart disease Maternal Grandmother   . Diabetes Maternal Grandmother   . Heart disease Maternal Grandfather   . Kidney disease Paternal Grandmother   . Cancer Paternal Grandfather        brain   . Heart attack Paternal Uncle     Current Outpatient Medications on File Prior to Visit  Medication Sig Dispense Refill  . ALPRAZolam (XANAX) 0.5 MG tablet TAKE ONE TABLET BY MOUTH TWICE DAILY AS NEEDED FOR ANXIETY. 60 tablet 2  . amphetamine-dextroamphetamine (ADDERALL XR) 15 MG 24 hr capsule Take by mouth daily.    Marland Kitchen amphetamine-dextroamphetamine  (ADDERALL XR) 20 MG 24 hr capsule Take 20 mg by mouth daily.    Marland Kitchen levothyroxine (SYNTHROID) 75 MCG tablet TAKE ONE TABLET BY MOUTH DAILY BEFORE BREAKFAST 90 tablet 4  . SUMAtriptan (IMITREX) 100 MG tablet Take 100 mg by mouth daily as needed.    . SUMAtriptan (IMITREX) 25 MG tablet Take 25 mg by mouth every 2 (two) hours as needed for migraine. May repeat in 2 hours if headache persists or recurs.    . conjugated estrogens (PREMARIN) vaginal cream Place [id number] Applicatorfuls vaginally 2 (two) times a week. (Patient not taking: Reported on 02/12/2019) 42.5 g 2   No current facility-administered medications on file prior to visit.     Allergies  Allergen Reactions  . Tylox [Oxycodone-Acetaminophen]     " Makes me feel like bugs a re crawling on me."    Social History   Substance and Sexual Activity  Alcohol Use No    Social History   Tobacco Use  Smoking Status Former Smoker  . Packs/day: 0.25  . Years: 2.00  . Pack years: 0.50  . Types: Cigarettes  Smokeless Tobacco Never Used  Tobacco Comment   Pt states that she only smokes at work not any where else.     Review of Systems  Constitutional: Negative.  HENT: Negative.   Eyes: Negative.   Respiratory: Negative.   Cardiovascular: Negative.   Gastrointestinal: Negative.   Genitourinary: Negative.   Musculoskeletal: Negative.   Skin: Negative.   Neurological: Negative.   Endo/Heme/Allergies: Negative.   Psychiatric/Behavioral: Negative.     Objective   Vitals:   02/12/19 1517  BP: 116/77  Pulse: 73  Resp: 16  Temp: (!) 96.4 F (35.8 C)  SpO2: 99%    Physical Exam Vitals signs reviewed.  Constitutional:      Appearance: Normal appearance. She is not ill-appearing.  HENT:     Head: Normocephalic and atraumatic.  Cardiovascular:     Rate and Rhythm: Normal rate and regular rhythm.     Heart sounds: Normal heart sounds. No murmur. No friction rub. No gallop.   Pulmonary:     Effort: Pulmonary effort is  normal. No respiratory distress.     Breath sounds: Normal breath sounds. No stridor. No wheezing, rhonchi or rales.  Genitourinary:    Comments: Examination limited secondary to pain.  A posterior anal fissure was noted.  No significant external hemorrhoidal disease was noted.  No active bleeding was noted.  Sphincter tone was tight secondary to pain. Skin:    General: Skin is warm and dry.  Neurological:     Mental Status: She is alert and oriented to person, place, and time.     Assessment  Anal fissure Plan   Patient is scheduled for anal fissurectomy, possible sphincterotomy.  The risks and benefits of the procedure including bleeding, infection, pain, incontinence, the possibility of recurrence of the fissure were fully explained to the patient, who gave informed consent.

## 2019-03-08 NOTE — Progress Notes (Signed)
Subjective:     Lisa Fernandez  Here for follow-up of rectal pain.  Patient states that she continues to have rectal pain from the anal fissure.  She has had no relief from conservative medical therapy. Objective:    BP 114/78 (BP Location: Left Arm, Patient Position: Sitting, Cuff Size: Normal)   Pulse 98   Temp (!) 96.8 F (36 C) (Tympanic)   Resp 16   Ht 5\' 7"  (1.702 m)   Wt 159 lb (72.1 kg)   SpO2 99%   BMI 24.90 kg/m   General:  alert, cooperative and no distress       Assessment:    Anal fissure, failure of conservative therapy    Plan:   We will take to the operating room for an anal fissurectomy, possible sphincterotomy.  The risks and benefits of the procedure including bleeding, infection, pain, incontinence, and the possibility of recurrence of the fissure were fully explained to the patient, who gave informed consent.

## 2019-03-11 ENCOUNTER — Ambulatory Visit (HOSPITAL_COMMUNITY)
Admission: RE | Admit: 2019-03-11 | Discharge: 2019-03-11 | Disposition: A | Discharge status: Home / Self Care | Payer: 59 | Attending: General Surgery | Admitting: General Surgery

## 2019-03-11 ENCOUNTER — Ambulatory Visit (HOSPITAL_COMMUNITY): Payer: 59 | Admitting: Anesthesiology

## 2019-03-11 ENCOUNTER — Other Ambulatory Visit: Payer: Self-pay

## 2019-03-11 ENCOUNTER — Encounter (HOSPITAL_COMMUNITY)
Admission: RE | Disposition: A | Discharge status: Home / Self Care | Payer: Self-pay | Source: Home / Self Care | Attending: General Surgery

## 2019-03-11 ENCOUNTER — Encounter (HOSPITAL_COMMUNITY): Payer: Self-pay | Admitting: Emergency Medicine

## 2019-03-11 DIAGNOSIS — Z79890 Hormone replacement therapy (postmenopausal): Secondary | ICD-10-CM | POA: Diagnosis not present

## 2019-03-11 DIAGNOSIS — K602 Anal fissure, unspecified: Secondary | ICD-10-CM | POA: Diagnosis not present

## 2019-03-11 DIAGNOSIS — E039 Hypothyroidism, unspecified: Secondary | ICD-10-CM | POA: Diagnosis not present

## 2019-03-11 DIAGNOSIS — Z87891 Personal history of nicotine dependence: Secondary | ICD-10-CM | POA: Diagnosis not present

## 2019-03-11 DIAGNOSIS — Z79899 Other long term (current) drug therapy: Secondary | ICD-10-CM | POA: Diagnosis not present

## 2019-03-11 DIAGNOSIS — K644 Residual hemorrhoidal skin tags: Secondary | ICD-10-CM | POA: Diagnosis not present

## 2019-03-11 DIAGNOSIS — F419 Anxiety disorder, unspecified: Secondary | ICD-10-CM | POA: Diagnosis not present

## 2019-03-11 DIAGNOSIS — K6289 Other specified diseases of anus and rectum: Secondary | ICD-10-CM | POA: Diagnosis present

## 2019-03-11 HISTORY — DX: Measles without complication: B05.9

## 2019-03-11 HISTORY — DX: Varicella without complication: B01.9

## 2019-03-11 HISTORY — PX: FISSURECTOMY: SHX5244

## 2019-03-11 HISTORY — DX: Migraine, unspecified, not intractable, without status migrainosus: G43.909

## 2019-03-11 SURGERY — FISSURECTOMY, ANAL
Anesthesia: General | Site: Rectum

## 2019-03-11 MED ORDER — FENTANYL CITRATE (PF) 100 MCG/2ML IJ SOLN
INTRAMUSCULAR | Status: DC | PRN
  Administered 2019-03-11: 100 ug via INTRAVENOUS

## 2019-03-11 MED ORDER — BUPIVACAINE HCL (PF) 0.5 % IJ SOLN
INTRAMUSCULAR | Status: AC
  Filled 2019-03-11: qty 30

## 2019-03-11 MED ORDER — FENTANYL CITRATE (PF) 100 MCG/2ML IJ SOLN
INTRAMUSCULAR | Status: AC
  Filled 2019-03-11: qty 2

## 2019-03-11 MED ORDER — DEXAMETHASONE SODIUM PHOSPHATE 10 MG/ML IJ SOLN
INTRAMUSCULAR | Status: AC
  Filled 2019-03-11: qty 1

## 2019-03-11 MED ORDER — PROPOFOL 10 MG/ML IV BOLUS
INTRAVENOUS | Status: DC | PRN
  Administered 2019-03-11: 200 mg via INTRAVENOUS

## 2019-03-11 MED ORDER — MEPERIDINE HCL 50 MG/ML IJ SOLN: 6.2500 mg | INTRAMUSCULAR | Status: DC | PRN

## 2019-03-11 MED ORDER — SCOPOLAMINE 1 MG/3DAYS TD PT72
1.0000 | MEDICATED_PATCH | Freq: Once | TRANSDERMAL | Status: DC
  Administered 2019-03-11: 08:00:00 1.5 mg via TRANSDERMAL

## 2019-03-11 MED ORDER — CHLORHEXIDINE GLUCONATE CLOTH 2 % EX PADS: 6.0000 | MEDICATED_PAD | Freq: Once | CUTANEOUS | Status: DC

## 2019-03-11 MED ORDER — ONDANSETRON HCL 4 MG/2ML IJ SOLN
INTRAMUSCULAR | Status: DC | PRN
  Administered 2019-03-11: 4 mg via INTRAVENOUS

## 2019-03-11 MED ORDER — PROPOFOL 10 MG/ML IV BOLUS
INTRAVENOUS | Status: AC
  Filled 2019-03-11: qty 20

## 2019-03-11 MED ORDER — OXYCODONE-ACETAMINOPHEN 7.5-325 MG PO TABS: 1.0000 | ORAL_TABLET | Freq: Four times a day (QID) | ORAL | 0 refills | Status: AC | PRN

## 2019-03-11 MED ORDER — HYDROMORPHONE HCL 1 MG/ML IJ SOLN: 0.5000 mg | INTRAMUSCULAR | Status: DC | PRN

## 2019-03-11 MED ORDER — MIDAZOLAM HCL 5 MG/5ML IJ SOLN
INTRAMUSCULAR | Status: DC | PRN
  Administered 2019-03-11: 2 mg via INTRAVENOUS

## 2019-03-11 MED ORDER — LACTATED RINGERS IV SOLN
Freq: Once | INTRAVENOUS | Status: AC
  Administered 2019-03-11: 08:00:00 via INTRAVENOUS

## 2019-03-11 MED ORDER — KETOROLAC TROMETHAMINE 30 MG/ML IJ SOLN
30.0000 mg | Freq: Once | INTRAMUSCULAR | Status: AC
  Administered 2019-03-11: 09:00:00 30 mg via INTRAVENOUS
  Filled 2019-03-11: qty 1

## 2019-03-11 MED ORDER — ONDANSETRON HCL 4 MG/2ML IJ SOLN: 4.0000 mg | Freq: Once | INTRAMUSCULAR | Status: DC | PRN

## 2019-03-11 MED ORDER — SODIUM CHLORIDE 0.9 % IV SOLN
2.0000 g | INTRAVENOUS | Status: AC
  Administered 2019-03-11: 2 g via INTRAVENOUS

## 2019-03-11 MED ORDER — SODIUM CHLORIDE 0.9 % IV SOLN
INTRAVENOUS | Status: AC
  Filled 2019-03-11: qty 2

## 2019-03-11 MED ORDER — SCOPOLAMINE 1 MG/3DAYS TD PT72
MEDICATED_PATCH | TRANSDERMAL | Status: AC
  Filled 2019-03-11: qty 1

## 2019-03-11 MED ORDER — LIDOCAINE 2% (20 MG/ML) 5 ML SYRINGE
INTRAMUSCULAR | Status: AC
  Filled 2019-03-11: qty 5

## 2019-03-11 MED ORDER — MIDAZOLAM HCL 2 MG/2ML IJ SOLN
INTRAMUSCULAR | Status: AC
  Filled 2019-03-11: qty 2

## 2019-03-11 MED ORDER — BUPIVACAINE LIPOSOME 1.3 % IJ SUSP
INTRAMUSCULAR | Status: DC | PRN
  Administered 2019-03-11: 15 mL

## 2019-03-11 MED ORDER — LIDOCAINE VISCOUS HCL 2 % MT SOLN
OROMUCOSAL | Status: DC | PRN
  Administered 2019-03-11: 1

## 2019-03-11 MED ORDER — SODIUM CHLORIDE 0.9 % IR SOLN
Status: DC | PRN
  Administered 2019-03-11: 1000 mL

## 2019-03-11 MED ORDER — LIDOCAINE VISCOUS HCL 2 % MT SOLN
OROMUCOSAL | Status: AC
  Filled 2019-03-11: qty 15

## 2019-03-11 MED ORDER — ONDANSETRON HCL 4 MG/2ML IJ SOLN
INTRAMUSCULAR | Status: AC
  Filled 2019-03-11: qty 2

## 2019-03-11 MED ORDER — BUPIVACAINE LIPOSOME 1.3 % IJ SUSP
INTRAMUSCULAR | Status: AC
  Filled 2019-03-11: qty 20

## 2019-03-11 MED ORDER — DEXAMETHASONE SODIUM PHOSPHATE 10 MG/ML IJ SOLN
INTRAMUSCULAR | Status: DC | PRN
  Administered 2019-03-11: 4 mg via INTRAVENOUS

## 2019-03-11 SURGICAL SUPPLY — 24 items
CLOTH BEACON ORANGE TIMEOUT ST (SAFETY) ×3 IMPLANT
COVER LIGHT HANDLE STERIS (MISCELLANEOUS) ×6 IMPLANT
DECANTER SPIKE VIAL GLASS SM (MISCELLANEOUS) ×3 IMPLANT
DRAPE HALF SHEET 40X57 (DRAPES) ×2 IMPLANT
ELECT REM PT RETURN 9FT ADLT (ELECTROSURGICAL) ×3
ELECTRODE REM PT RTRN 9FT ADLT (ELECTROSURGICAL) ×1 IMPLANT
GAUZE SPONGE 4X4 12PLY STRL (GAUZE/BANDAGES/DRESSINGS) ×4 IMPLANT
GLOVE BIO SURGEON STRL SZ7 (GLOVE) ×2 IMPLANT
GLOVE BIOGEL PI IND STRL 7.0 (GLOVE) ×1 IMPLANT
GLOVE BIOGEL PI INDICATOR 7.0 (GLOVE) ×4
GLOVE SURG SS PI 7.5 STRL IVOR (GLOVE) ×3 IMPLANT
GOWN STRL REUS W/TWL LRG LVL3 (GOWN DISPOSABLE) ×6 IMPLANT
HEMOSTAT SURGICEL 4X8 (HEMOSTASIS) ×3 IMPLANT
KIT TURNOVER KIT A (KITS) ×3 IMPLANT
MANIFOLD NEPTUNE II (INSTRUMENTS) ×3 IMPLANT
NDL HYPO 25X1 1.5 SAFETY (NEEDLE) ×1 IMPLANT
NEEDLE HYPO 25X1 1.5 SAFETY (NEEDLE) ×3 IMPLANT
NS IRRIG 1000ML POUR BTL (IV SOLUTION) ×3 IMPLANT
PACK PERI GYN (CUSTOM PROCEDURE TRAY) ×3 IMPLANT
PAD ARMBOARD 7.5X6 YLW CONV (MISCELLANEOUS) ×3 IMPLANT
SET BASIN LINEN APH (SET/KITS/TRAYS/PACK) ×3 IMPLANT
SUT SILK 0 FSL (SUTURE) ×3 IMPLANT
SUT VIC AB 2-0 CT2 27 (SUTURE) ×5 IMPLANT
SYR CONTROL 10ML LL (SYRINGE) ×3 IMPLANT

## 2019-03-11 NOTE — Interval H&P Note (Signed)
History and Physical Interval Note:  03/11/2019 7:29 AM  Lisa Fernandez  has presented today for surgery, with the diagnosis of anal fissure.  The various methods of treatment have been discussed with the patient and family. After consideration of risks, benefits and other options for treatment, the patient has consented to  Procedure(s): FISSURECTOMY (N/A) as a surgical intervention.  The patient's history has been reviewed, patient examined, no change in status, stable for surgery.  I have reviewed the patient's chart and labs.  Questions were answered to the patient's satisfaction.     Aviva Signs

## 2019-03-11 NOTE — Anesthesia Preprocedure Evaluation (Addendum)
Anesthesia Evaluation  Patient identified by MRN, date of birth, ID band Patient awake    Reviewed: Allergy & Precautions, NPO status , Patient's Chart, lab work & pertinent test results  History of Anesthesia Complications (+) PONV and history of anesthetic complications (PONV, Low blood pressure)  Airway Mallampati: II  TM Distance: >3 FB Neck ROM: Full    Dental  (+) Missing   Pulmonary former smoker,    breath sounds clear to auscultation       Cardiovascular  Rhythm:Regular Rate:Normal     Neuro/Psych  Headaches, Anxiety    GI/Hepatic negative GI ROS, Neg liver ROS,   Endo/Other  Hypothyroidism   Renal/GU      Musculoskeletal   Abdominal   Peds  Hematology   Anesthesia Other Findings   Reproductive/Obstetrics                           Anesthesia Physical Anesthesia Plan  ASA: II  Anesthesia Plan: General   Post-op Pain Management:    Induction: Intravenous  PONV Risk Score and Plan: 4 or greater and Ondansetron, Scopolamine patch - Pre-op, Midazolam and Dexamethasone  Airway Management Planned: LMA  Additional Equipment:   Intra-op Plan:   Post-operative Plan: Extubation in OR  Informed Consent: I have reviewed the patients History and Physical, chart, labs and discussed the procedure including the risks, benefits and alternatives for the proposed anesthesia with the patient or authorized representative who has indicated his/her understanding and acceptance.     Dental advisory given  Plan Discussed with: CRNA  Anesthesia Plan Comments:      Anesthesia Quick Evaluation

## 2019-03-11 NOTE — Anesthesia Postprocedure Evaluation (Signed)
Anesthesia Post Note  Patient: Lisa Fernandez  Procedure(s) Performed: FISSURECTOMY (N/A Rectum)  Patient location during evaluation: PACU Anesthesia Type: General Level of consciousness: awake, awake and alert, oriented and patient cooperative Pain management: pain level controlled Vital Signs Assessment: post-procedure vital signs reviewed and stable Respiratory status: spontaneous breathing, nonlabored ventilation and respiratory function stable Cardiovascular status: stable Postop Assessment: no apparent nausea or vomiting Anesthetic complications: no     Last Vitals:  Vitals:   03/11/19 0712  BP: 126/87  Pulse: 82  Resp: 17  Temp: 36.7 C  SpO2: 100%    Last Pain:  Vitals:   03/11/19 0712  TempSrc: Oral  PainSc: 0-No pain                 Shary Lamos

## 2019-03-11 NOTE — Discharge Instructions (Signed)
Anal Fissure, Adult  An anal fissure is a small tear or crack in the tissue around the opening of the butt (anus). Bleeding from the tear or crack usually stops on its own within a few minutes. The bleeding may happen every time you poop (have a bowel movement) until the tear or crack heals. What are the causes? This condition is usually caused by passing a large or hard poop (stool). Other causes include:  Trouble pooping (constipation).  Passing watery poop (diarrhea).  Inflammatory bowel disease (Crohn's disease or ulcerative colitis).  Childbirth.  Infections.  Anal sex. What are the signs or symptoms? Symptoms of this condition include:  Bleeding from the butt.  Small amounts of blood on your poop. The blood coats the outside of the poop. It is not mixed with the poop.  Small amounts of blood on the toilet paper or in the toilet after you poop.  Pain when passing poop.  Itching or irritation around the opening of the butt. How is this diagnosed? This condition may be diagnosed based on a physical exam. Your doctor may:  Check your butt. A tear can often be seen by checking the area with care.  Check your butt using a short tube (anoscope). The light in the tube will show any problems in your butt. How is this treated? Treatment for this condition may include:  Treating problems that make it hard for you to pass poop. You may be told to: ? Eat more fiber. ? Drink more fluid. ? Take fiber supplements. ? Take medicines that make poop soft.  Taking sitz baths. This may help to heal the tear.  Using creams and ointments. If your condition gets worse, other treatments may be needed such as:  A shot near the tear or crack (botulinum injection).  Surgery to repair the tear or crack. Follow these instructions at home: Eating and drinking   Avoid bananas and dairy products. These foods can make it hard to poop.  Drink enough fluid to keep your pee (urine) pale  yellow.  Eat foods that have a lot of fiber in them, such as: ? Beans. ? Whole grains. ? Fresh fruits. ? Fresh vegetables. General instructions   Take over-the-counter and prescription medicines only as told by your doctor.  Use creams or ointments only as told by your doctor.  Keep the butt area as clean and dry as you can.  Take a warm water bath (sitz bath) as told by your doctor. Do not use soap.  Keep all follow-up visits as told by your doctor. This is important. Contact a doctor if:  You have more bleeding.  You have a fever.  You have watery poop that is mixed with blood.  You have pain.  Your problem gets worse, not better. Summary  An anal fissure is a small tear or crack in the skin around the opening of the butt (anus).  This condition is usually caused by passing a large or hard poop (stool).  Treatment includes treating the problems that make it hard for you to pass poop.  Follow your doctor's instructions about caring for your condition at home.  Keep all follow-up visits as told by your doctor. This is important. This information is not intended to replace advice given to you by your health care provider. Make sure you discuss any questions you have with your health care provider. Document Released: 01/19/2011 Document Revised: 11/02/2017 Document Reviewed: 11/02/2017 Elsevier Patient Education  2020 [employer].  General Anesthesia, Adult, Care After This sheet gives you information about how to care for yourself after your procedure. Your health care provider may also give you more specific instructions. If you have problems or questions, contact your health care provider. What can I expect after the procedure? After the procedure, the following side effects are common:  Pain or discomfort at the IV site.  Nausea.  Vomiting.  Sore throat.  Trouble concentrating.  Feeling cold or chills.  Weak or tired.  Sleepiness and  fatigue.  Soreness and body aches. These side effects can affect parts of the body that were not involved in surgery. Follow these instructions at home:  For at least 24 hours after the procedure:  Have a responsible adult stay with you. It is important to have someone help care for you until you are awake and alert.  Rest as needed.  Do not: ? Participate in activities in which you could fall or become injured. ? Drive. ? Use heavy machinery. ? Drink alcohol. ? Take sleeping pills or medicines that cause drowsiness. ? Make important decisions or sign legal documents. ? Take care of children on your own. Eating and drinking  Follow any instructions from your health care provider about eating or drinking restrictions.  When you feel hungry, start by eating small amounts of foods that are soft and easy to digest (bland), such as toast. Gradually return to your regular diet.  Drink enough fluid to keep your urine pale yellow.  If you vomit, rehydrate by drinking water, juice, or clear broth. General instructions  If you have sleep apnea, surgery and certain medicines can increase your risk for breathing problems. Follow instructions from your health care provider about wearing your sleep device: ? Anytime you are sleeping, including during daytime naps. ? While taking prescription pain medicines, sleeping medicines, or medicines that make you drowsy.  Return to your normal activities as told by your health care provider. Ask your health care provider what activities are safe for you.  Take over-the-counter and prescription medicines only as told by your health care provider.  If you smoke, do not smoke without supervision.  Keep all follow-up visits as told by your health care provider. This is important. Contact a health care provider if:  You have nausea or vomiting that does not get better with medicine.  You cannot eat or drink without vomiting.  You have pain that  does not get better with medicine.  You are unable to pass urine.  You develop a skin rash.  You have a fever.  You have redness around your IV site that gets worse. Get help right away if:  You have difficulty breathing.  You have chest pain.  You have blood in your urine or stool, or you vomit blood. Summary  After the procedure, it is common to have a sore throat or nausea. It is also common to feel tired.  Have a responsible adult stay with you for the first 24 hours after general anesthesia. It is important to have someone help care for you until you are awake and alert.  When you feel hungry, start by eating small amounts of foods that are soft and easy to digest (bland), such as toast. Gradually return to your regular diet.  Drink enough fluid to keep your urine pale yellow.  Return to your normal activities as told by your health care provider. Ask your health care provider what activities are safe for you. This information is  not intended to replace advice given to you by your health care provider. Make sure you discuss any questions you have with your health care provider. Document Released: 08/29/2000 Document Revised: 05/26/2017 Document Reviewed: 01/06/2017 Elsevier Patient Education  2020 [employer].

## 2019-03-11 NOTE — Transfer of Care (Signed)
Immediate Anesthesia Transfer of Care Note  Patient: Lisa Fernandez  Procedure(s) Performed: FISSURECTOMY (N/A Rectum)  Patient Location: PACU  Anesthesia Type:General  Level of Consciousness: awake, alert  and oriented  Airway & Oxygen Therapy: Patient Spontanous Breathing  Post-op Assessment: Report given to RN, Post -op Vital signs reviewed and stable and Patient moving all extremities X 4  Post vital signs: Reviewed and stable  Last Vitals:  Vitals Value Taken Time  BP    Temp    Pulse    Resp    SpO2      Last Pain:  Vitals:   03/11/19 0712  TempSrc: Oral  PainSc: 0-No pain         Complications: No apparent anesthesia complications

## 2019-03-11 NOTE — Anesthesia Procedure Notes (Signed)
Procedure Name: LMA Insertion Date/Time: 03/11/2019 8:27 AM Performed by: Jonna Munro, CRNA Pre-anesthesia Checklist: Patient identified, Emergency Drugs available, Suction available, Patient being monitored and Timeout performed Patient Re-evaluated:Patient Re-evaluated prior to induction Oxygen Delivery Method: Circle system utilized Preoxygenation: Pre-oxygenation with 100% oxygen Induction Type: IV induction LMA: LMA inserted LMA Size: 4.0 Number of attempts: 1 Placement Confirmation: positive ETCO2 and breath sounds checked- equal and bilateral Tube secured with: Tape Dental Injury: Teeth and Oropharynx as per pre-operative assessment

## 2019-03-11 NOTE — Op Note (Signed)
Patient:  Lisa Fernandez  DOB:  Jul 24, 1955  MRN:  [id number]   Preop Diagnosis: Anal fissure  Postop Diagnosis: Same  Procedure: Anal fissurectomy  Surgeon: Aviva Signs, MD  Anes: General  Indications: Patient is a 63 year old white female who presents for a fissurectomy.  The risks and benefits of the procedure including bleeding, infection, incontinence, the possibility of recurrence of the fissure were fully explained to the patient, who gave informed consent.  Procedure note: The patient was placed in the lithotomy position after general anesthesia was administered.  The perineum was prepped and draped using the usual sterile technique with Betadine.  Surgical site confirmation was performed.  On rectal examination, the patient did have a posterior anal fissure with a sentinel tag.  The internal sphincter was inspected and did not appear to be strictured.  It seemed to relax appropriately.  An additional papilla was noted along the dentate line at the 6 o'clock position.  The papilla was excised using the LigaSure along with the sentinel tag.  Mucosal flaps were created on either side of the fissure.  Any granulation tissue was removed without difficulty.  Care was taken to avoid the external sphincter mechanism.  The mucosal edges were reapproximated using a 2-0 Vicryl interrupted suture.  Exparel was instilled into the surrounding region.  Surgicel and Viscous Xylocaine rectal packing was then placed.  All tape and needle counts were correct at the end of the procedure.  The patient was awakened and transferred to PACU in stable condition.    Complications: None  EBL: Minimal  Specimen: None

## 2019-03-12 ENCOUNTER — Encounter (HOSPITAL_COMMUNITY): Payer: Self-pay | Admitting: General Surgery

## 2019-03-12 ENCOUNTER — Ambulatory Visit: Payer: 59 | Admitting: General Surgery

## 2019-03-14 ENCOUNTER — Other Ambulatory Visit (HOSPITAL_COMMUNITY): Payer: Self-pay | Admitting: Obstetrics and Gynecology

## 2019-03-14 DIAGNOSIS — Z1231 Encounter for screening mammogram for malignant neoplasm of breast: Secondary | ICD-10-CM

## 2019-03-18 ENCOUNTER — Ambulatory Visit (HOSPITAL_COMMUNITY)
Admission: RE | Admit: 2019-03-18 | Discharge: 2019-03-18 | Disposition: A | Discharge status: Home / Self Care | Payer: 59 | Source: Ambulatory Visit | Attending: Obstetrics and Gynecology | Admitting: Obstetrics and Gynecology

## 2019-03-18 ENCOUNTER — Other Ambulatory Visit: Payer: Self-pay

## 2019-03-18 DIAGNOSIS — Z1231 Encounter for screening mammogram for malignant neoplasm of breast: Secondary | ICD-10-CM | POA: Diagnosis not present

## 2019-03-19 ENCOUNTER — Ambulatory Visit (INDEPENDENT_AMBULATORY_CARE_PROVIDER_SITE_OTHER): Payer: Self-pay | Admitting: General Surgery

## 2019-03-19 ENCOUNTER — Encounter: Payer: Self-pay | Admitting: General Surgery

## 2019-03-19 VITALS — BP 118/80 | HR 86 | Temp 97.3°F | Resp 16 | Ht 67.0 in | Wt 157.0 lb

## 2019-03-19 DIAGNOSIS — Z09 Encounter for follow-up examination after completed treatment for conditions other than malignant neoplasm: Secondary | ICD-10-CM

## 2019-03-19 NOTE — Progress Notes (Signed)
Subjective:     Lisa Fernandez  Patient here for postoperative visit.  Patient doing very well.  Her rectal spasms have resolved.  She does not have a blood per rectum.  She has no incontinence.  She was having some multiple bowel movements, but she has cut back on taking Colace.  Her rectal pain has resolved. Objective:    BP 118/80 (BP Location: Left Arm, Patient Position: Sitting, Cuff Size: Normal)   Pulse 86   Temp (!) 97.3 F (36.3 C) (Tympanic)   Resp 16   Ht 5\' 7"  (1.702 m)   Wt 157 lb (71.2 kg)   SpO2 98%   BMI 24.59 kg/m   General:  alert, cooperative and no distress  Anal fissure is healing well.  No active bleeding is noted.  Minimal swelling present.     Assessment:    Doing well postoperatively.    Plan:   Try to avoid defecating multiple times a day.  Continue current sitz baths.  Follow-up here in 3 weeks.

## 2019-04-09 ENCOUNTER — Ambulatory Visit: Payer: 59 | Admitting: General Surgery

## 2019-04-09 ENCOUNTER — Ambulatory Visit: Payer: Self-pay | Admitting: General Surgery

## 2020-05-06 ENCOUNTER — Ambulatory Visit (INDEPENDENT_AMBULATORY_CARE_PROVIDER_SITE_OTHER): Payer: PRIVATE HEALTH INSURANCE

## 2020-05-06 ENCOUNTER — Ambulatory Visit (INDEPENDENT_AMBULATORY_CARE_PROVIDER_SITE_OTHER): Payer: PRIVATE HEALTH INSURANCE | Admitting: Orthopaedic Surgery

## 2020-05-06 ENCOUNTER — Encounter: Payer: Self-pay | Admitting: Orthopaedic Surgery

## 2020-05-06 ENCOUNTER — Other Ambulatory Visit: Payer: Self-pay

## 2020-05-06 VITALS — Ht 67.0 in | Wt 157.0 lb

## 2020-05-06 DIAGNOSIS — M1711 Unilateral primary osteoarthritis, right knee: Secondary | ICD-10-CM | POA: Diagnosis not present

## 2020-05-06 DIAGNOSIS — M25561 Pain in right knee: Secondary | ICD-10-CM

## 2020-05-06 MED ORDER — LIDOCAINE HCL 1 % IJ SOLN
2.0000 mL | INTRAMUSCULAR | Status: AC | PRN
  Administered 2020-05-06: 2 mL

## 2020-05-06 MED ORDER — BUPIVACAINE HCL 0.5 % IJ SOLN
2.0000 mL | INTRAMUSCULAR | Status: AC | PRN
  Administered 2020-05-06: 2 mL via INTRA_ARTICULAR

## 2020-05-06 NOTE — Progress Notes (Signed)
Office Visit Note   Patient: Lisa Fernandez           Date of Birth: 01/05/1956           MRN: [id number] Visit Date: 05/06/2020              Requested by: Caryl Bis, MD [place],  [place] [id number] PCP: Caryl Bis, MD   Assessment & Plan: Visit Diagnoses:  1. Acute pain of right knee   2. Unilateral primary osteoarthritis, right knee     Plan: Recurrent symptoms of osteoarthritis right knee predominantly involving lateral compartment pain.  Films demonstrate some progression of the arthritis in the lateral compartment compared to films in 2019.  Will reinject cortisone and monitor response.  She might be a candidate for viscosupplementation  Follow-Up Instructions: Return if symptoms worsen or fail to improve.   Orders:  Orders Placed This Encounter  Procedures  . Large Joint Inj: R knee  . XR KNEE 3 VIEW RIGHT   No orders of the defined types were placed in this encounter.     Procedures: Large Joint Inj: R knee on 05/06/2020 3:05 PM Indications: pain and diagnostic evaluation Details: 25 G 1.5 in needle, anteromedial approach  Arthrogram: No  Medications: 2 mL lidocaine 1 %; 2 mL bupivacaine 0.5 %  2 mL betamethasone injected with lidocaine and Marcaine Procedure, treatment alternatives, risks and benefits explained, specific risks discussed. Consent was given by the patient. Immediately prior to procedure a time out was called to verify the correct patient, procedure, equipment, support staff and site/side marked as required. Patient was prepped and draped in the usual sterile fashion.       Clinical Data: No additional findings.   Subjective: Chief Complaint  Patient presents with  . Right Knee - Pain  Patient presents today for right knee pain. She said that when she steps down on leg it is painful. The pain is located laterally. It feels like knee is weak and going to give way. She has tried over the counter pain medicine, but they do not  help.   HPI  Review of Systems   Objective: Vital Signs: Ht 5\' 7"  (1.702 m)   Wt 157 lb (71.2 kg)   BMI 24.59 kg/m   Physical Exam Constitutional:      Appearance: She is well-developed.  Eyes:     Pupils: Pupils are equal, round, and reactive to light.  Pulmonary:     Effort: Pulmonary effort is normal.  Skin:    General: Skin is warm and dry.  Neurological:     Mental Status: She is alert and oriented to person, place, and time.  Psychiatric:        Behavior: Behavior normal.     Ortho Exam right knee with predominant lateral joint pain and slightly increased valgus.  No effusion.  No medial joint pain or patella discomfort.  Full extension flexed over 100 degrees without instability.  No popliteal pain or mass.  No calf pain.  Straight leg raise negative.  Painless range of motion both hips  Specialty Comments:  No specialty comments available.  Imaging: XR KNEE 3 VIEW RIGHT  Result Date: 05/06/2020 Films of the right knee were obtained in 3 projections standing.  There are obvious degenerative changes in the lateral compartment with subchondral sclerosis on both sides of the joint and peripheral osteophytes.  There is slightly more valgus and joint space narrowing laterally compared to the films  performed in 2019.  There are some degenerative changes the patellofemoral joint with osteophyte formation but the patella tracks in the midline.  No ectopic calcification.  Films are consistent with moderate to advanced osteoarthritis predominantly affecting the lateral compartment    PMFS History: Patient Active Problem List   Diagnosis Date Noted  . Unilateral primary osteoarthritis, right knee 05/06/2020  . Anal fissure   . Fecal occult blood test positive 05/31/2017  . Hypothyroidism 05/31/2017  . Annual physical exam 05/25/2015  . Anxiety 05/25/2015   Past Medical History:  Diagnosis Date  . Anxiety   . Chicken pox   . Complication of anesthesia    Blood  pressure drops after surgery  . Hypothyroid   . Measles   . Migraines   . PONV (postoperative nausea and vomiting)     Family History  Problem Relation Age of Onset  . Cancer Mother        bladder  . Emphysema Father   . COPD Father   . Heart attack Father   . Diabetes Sister   . Diabetes Maternal Uncle   . Cancer Paternal Aunt   . Cancer Paternal Uncle   . Heart disease Maternal Grandmother   . Diabetes Maternal Grandmother   . Heart disease Maternal Grandfather   . Kidney disease Paternal Grandmother   . Cancer Paternal Grandfather        brain   . Heart attack Paternal Uncle     Past Surgical History:  Procedure Laterality Date  . ABDOMINAL HYSTERECTOMY    . CESAREAN SECTION     3  . FISSURECTOMY N/A 03/11/2019   Procedure: FISSURECTOMY;  Surgeon: Aviva Signs, MD;  Location: [facility];  Service: General;  Laterality: N/A;  . TONSILLECTOMY     Social History   Occupational History  . Not on file  Tobacco Use  . Smoking status: Former Smoker    Packs/day: 0.25    Years: 2.00    Pack years: 0.50    Types: Cigarettes  . Smokeless tobacco: Never Used  . Tobacco comment: Pt states that she only smokes at work not any where else.   Vaping Use  . Vaping Use: Never used  Substance and Sexual Activity  . Alcohol use: No  . Drug use: No  . Sexual activity: Yes    Birth control/protection: Surgical    Comment: hyst

## 2020-05-12 ENCOUNTER — Other Ambulatory Visit: Payer: Self-pay | Admitting: Orthopedic Surgery

## 2020-05-12 ENCOUNTER — Telehealth: Payer: Self-pay

## 2020-05-12 NOTE — Telephone Encounter (Signed)
Patient called [facility] office. She states that the cortisone injection did not help, and if anything made her knee worse. I called and talked with patient. She is wanting to try an antiiflammatory for her knee. We discussed Voltaren gel. She is going to try this and see if it helps provide her some relief. If not, she is going to let us know.

## 2020-06-10 ENCOUNTER — Telehealth: Payer: Self-pay | Admitting: Radiology

## 2020-06-10 ENCOUNTER — Ambulatory Visit (INDEPENDENT_AMBULATORY_CARE_PROVIDER_SITE_OTHER): Payer: PRIVATE HEALTH INSURANCE | Admitting: Orthopaedic Surgery

## 2020-06-10 ENCOUNTER — Other Ambulatory Visit: Payer: Self-pay

## 2020-06-10 ENCOUNTER — Encounter: Payer: Self-pay | Admitting: Orthopaedic Surgery

## 2020-06-10 VITALS — Ht 67.0 in | Wt 151.0 lb

## 2020-06-10 DIAGNOSIS — M1711 Unilateral primary osteoarthritis, right knee: Secondary | ICD-10-CM

## 2020-06-10 NOTE — Progress Notes (Signed)
Office Visit Note   Patient: Lisa Fernandez           Date of Birth: [birth year]           MRN: [id number] Visit Date: 06/10/2020              Requested by: Richardean Chimera, MD [address] [place],  [place] [address] PCP: Richardean Chimera, MD   Assessment & Plan: Visit Diagnoses:  1. Unilateral primary osteoarthritis, right knee     Plan: Lisa Fernandez has osteoarthritis in her right knee particularly in the lateral compartment.  She has had 2 cortisone injections which have given her only temporary relief.  She is also had significant pain after the injections.  She is inquiring about viscosupplementation.  I discussed this with her in some detail and she would like [place] to go ahead and pre-CERT for the right knee This patient is diagnosed with osteoarthritis of the knee(s).    Radiographs show evidence of joint space narrowing, osteophytes, subchondral sclerosis and/or subchondral cysts.  This patient has knee pain which interferes with functional and activities of daily living.    This patient has experienced inadequate response, adverse effects and/or intolerance with conservative treatments such as acetaminophen, NSAIDS, topical creams, physical therapy or regular exercise, knee bracing and/or weight loss.   This patient has experienced inadequate response or has a contraindication to intra articular steroid injections for at least 3 months.   This patient is not scheduled to have a total knee replacement within 6 months of starting treatment with viscosupplementation.   Follow-Up Instructions: Return Pre-CERT Visco supplementation right knee.   Orders:  No orders of the defined types were placed in this encounter.  No orders of the defined types were placed in this encounter.     Procedures: No procedures performed   Clinical Data: No additional findings.   Subjective: Chief Complaint  Patient presents with  . Right Knee - Pain  Patient presents with continued right knee pain.  She had a cortisone injection on 05/06/2020 and states that the pain was significantly worse x at least two weeks after that. She was having great difficulty with walking and would have to sit down before she could get going again. Although some of the pain has decreased, she continues to feel that the knee is unstable and that she is overcompensating because of it. She describes feeling that the knee is going to give way.  There is a spot laterally that is sensitive to touch and causes pain to go down into her lower leg. She was taking a lot of ibuprofen which she states caused blood in her urine and required her to take Cipro. She is now taking Tylenol Arthritis as needed. She questions whether she would benefit with the gel injections. She walks and climbs daily and does not want surgery if it can be helped.  HPI  Review of Systems   Objective: Vital Signs: Ht 5\' 7"  (1.702 m)   Wt 151 lb (68.5 kg)   BMI 23.65 kg/m   Physical Exam Constitutional:      Appearance: She is well-developed and well-nourished.  HENT:     Mouth/Throat:     Mouth: Oropharynx is clear and moist.  Eyes:     Extraocular Movements: EOM normal.     Pupils: Pupils are equal, round, and reactive to light.  Pulmonary:     Effort: Pulmonary effort is normal.  Skin:    General: Skin is  warm and dry.  Neurological:     Mental Status: She is alert and oriented to person, place, and time.  Psychiatric:        Mood and Affect: Mood and affect normal.        Behavior: Behavior normal.     Ortho Exam awake alert and oriented x3.  Comfortable sitting.  Predominately lateral pain right knee at the joint line.  May be a very minimal effusion.  No pain medially.  Full extension of flexed over 105 degrees without instability.  No popliteal pain or calf pain.  Neurologically intact  Specialty Comments:  No specialty comments available.  Imaging: No results found.   PMFS History: Patient Active Problem List    Diagnosis Date Noted  . Unilateral primary osteoarthritis, right knee 05/06/2020  . Anal fissure   . Fecal occult blood test positive 05/31/2017  . Hypothyroidism 05/31/2017  . Annual physical exam 05/25/2015  . Anxiety 05/25/2015   Past Medical History:  Diagnosis Date  . Anxiety   . Chicken pox   . Complication of anesthesia    Blood pressure drops after surgery  . Hypothyroid   . Measles   . Migraines   . PONV (postoperative nausea and vomiting)     Family History  Problem Relation Age of Onset  . Cancer Mother        bladder  . Emphysema Father   . COPD Father   . Heart attack Father   . Diabetes Sister   . Diabetes Maternal Uncle   . Cancer Paternal Aunt   . Cancer Paternal Uncle   . Heart disease Maternal Grandmother   . Diabetes Maternal Grandmother   . Heart disease Maternal Grandfather   . Kidney disease Paternal Grandmother   . Cancer Paternal Grandfather        brain   . Heart attack Paternal Uncle     Past Surgical History:  Procedure Laterality Date  . ABDOMINAL HYSTERECTOMY    . CESAREAN SECTION     3  . FISSURECTOMY N/A 03/11/2019   Procedure: FISSURECTOMY;  Surgeon: Franky Macho, MD;  Location: [facility];  Service: General;  Laterality: N/A;  . TONSILLECTOMY     Social History   Occupational History  . Not on file  Tobacco Use  . Smoking status: Former Smoker    Packs/day: 0.25    Years: 2.00    Pack years: 0.50    Types: Cigarettes  . Smokeless tobacco: Never Used  . Tobacco comment: Pt states that she only smokes at work not any where else.   Vaping Use  . Vaping Use: Never used  Substance and Sexual Activity  . Alcohol use: No  . Drug use: No  . Sexual activity: Yes    Birth control/protection: Surgical    Comment: hyst

## 2020-06-10 NOTE — Telephone Encounter (Signed)
Noted  

## 2020-06-10 NOTE — Telephone Encounter (Signed)
Please obtain authorization for gel injection right knee. Patient would like to follow up in the [place] office for injection. Dr. Cleophas Dunker. Thanks.

## 2020-07-08 ENCOUNTER — Telehealth: Payer: Self-pay

## 2020-07-08 NOTE — Telephone Encounter (Signed)
Submitted VOB, Orthovisc series, right knee.  

## 2020-07-17 ENCOUNTER — Telehealth: Payer: Self-pay

## 2020-07-17 NOTE — Telephone Encounter (Signed)
Received PA form from [facility] for Orthovisc, right knee. Faxed completed PA form and clinicals to [facility] at [phone].

## 2020-07-17 NOTE — Telephone Encounter (Signed)
PA required for Orthovisc, right knee.  Called patient's insurance and spoke with Anne Fu was advised that PA form is being faxed and once completed and faxed back, process for approval could take up to 16 calendar days.  Faxed office notes to [phone]

## 2020-08-03 ENCOUNTER — Telehealth: Payer: Self-pay

## 2020-08-03 NOTE — Telephone Encounter (Signed)
Called [employer] Utilization Review Team to check if PA is required for [id number].  Per Roanna Epley M.,PA is required and [phone] can be added to approval for [id number] due to services not being rendered yet.  Faxed office notes to [phone] for [phone] to be added.

## 2020-08-05 ENCOUNTER — Telehealth: Payer: Self-pay

## 2020-08-05 NOTE — Telephone Encounter (Signed)
Approved for Orthovisc, right knee. Buy & Bill Must meet deductible first Patient will be responsible for 20% OOP. May have a Co-pay of $45.00 PA Approval# [id number] Valid 07/31/2020-01/27/2021  Appt. 08/11/2020 with Dr. Durward Fortes

## 2020-08-11 ENCOUNTER — Ambulatory Visit: Payer: PRIVATE HEALTH INSURANCE | Admitting: Orthopaedic Surgery

## 2020-08-12 ENCOUNTER — Ambulatory Visit (INDEPENDENT_AMBULATORY_CARE_PROVIDER_SITE_OTHER): Payer: PRIVATE HEALTH INSURANCE | Admitting: Orthopaedic Surgery

## 2020-08-12 ENCOUNTER — Other Ambulatory Visit: Payer: Self-pay

## 2020-08-12 ENCOUNTER — Encounter: Payer: Self-pay | Admitting: Orthopaedic Surgery

## 2020-08-12 VITALS — Ht 67.0 in | Wt 151.0 lb

## 2020-08-12 DIAGNOSIS — M1711 Unilateral primary osteoarthritis, right knee: Secondary | ICD-10-CM | POA: Diagnosis not present

## 2020-08-12 MED ORDER — HYALURONAN 30 MG/2ML IX SOSY
30.0000 mg | PREFILLED_SYRINGE | INTRA_ARTICULAR | Status: AC | PRN
  Administered 2020-08-12: 30 mg via INTRA_ARTICULAR

## 2020-08-12 NOTE — Progress Notes (Signed)
Office Visit Note   Patient: Lisa Fernandez           Date of Birth: 26-Jul-1955           MRN: [id number] Visit Date: 08/12/2020              Requested by: Caryl Bis, MD [place],  [place] [address] PCP: Caryl Bis, MD   Assessment & Plan: Visit Diagnoses:  1. Unilateral primary osteoarthritis, right knee     Plan: First Orthovisc injection right knee performed without difficulty.  Return weekly for the next 2 weeks to complete the series  Follow-Up Instructions: Return in about 1 week (around 08/19/2020).   Orders:  Orders Placed This Encounter  Procedures  . Large Joint Inj: R knee   No orders of the defined types were placed in this encounter.     Procedures: Large Joint Inj: R knee on 08/12/2020 9:27 AM Indications: pain and joint swelling Details: 25 G 1.5 in needle  Arthrogram: No  Medications: 30 mg Hyaluronan 30 MG/2ML Outcome: tolerated well, no immediate complications Procedure, treatment alternatives, risks and benefits explained, specific risks discussed. Consent was given by the patient. Immediately prior to procedure a time out was called to verify the correct patient, procedure, equipment, support staff and site/side marked as required. Patient was prepped and draped in the usual sterile fashion.       Clinical Data: No additional findings.   Subjective: Chief Complaint  Patient presents with  . Right Knee - Follow-up    Orthovisc #1  Patient presents today for her first Orthovisc injection in her right knee.  HPI  Review of Systems   Objective: Vital Signs: Ht 5\' 7"  (1.702 m)   Wt 151 lb (68.5 kg)   BMI 23.65 kg/m   Physical Exam  Ortho Exam right knee was not hot warm red or swollen.  No effusion.  No instability.  Full extension flexed over 100 degrees  Specialty Comments:  No specialty comments available.  Imaging: No results found.   PMFS History: Patient Active Problem List   Diagnosis Date Noted  .  Unilateral primary osteoarthritis, right knee 05/06/2020  . Anal fissure   . Fecal occult blood test positive 05/31/2017  . Hypothyroidism 05/31/2017  . Annual physical exam 05/25/2015  . Anxiety 05/25/2015   Past Medical History:  Diagnosis Date  . Anxiety   . Chicken pox   . Complication of anesthesia    Blood pressure drops after surgery  . Hypothyroid   . Measles   . Migraines   . PONV (postoperative nausea and vomiting)     Family History  Problem Relation Age of Onset  . Cancer Mother        bladder  . Emphysema Father   . COPD Father   . Heart attack Father   . Diabetes Sister   . Diabetes Maternal Uncle   . Cancer Paternal Aunt   . Cancer Paternal Uncle   . Heart disease Maternal Grandmother   . Diabetes Maternal Grandmother   . Heart disease Maternal Grandfather   . Kidney disease Paternal Grandmother   . Cancer Paternal Grandfather        brain   . Heart attack Paternal Uncle     Past Surgical History:  Procedure Laterality Date  . ABDOMINAL HYSTERECTOMY    . CESAREAN SECTION     3  . FISSURECTOMY N/A 03/11/2019   Procedure: FISSURECTOMY;  Surgeon: Aviva Signs,  MD;  Location: [facility];  Service: General;  Laterality: N/A;  . TONSILLECTOMY     Social History   Occupational History  . Not on file  Tobacco Use  . Smoking status: Former Smoker    Packs/day: 0.25    Years: 2.00    Pack years: 0.50    Types: Cigarettes  . Smokeless tobacco: Never Used  . Tobacco comment: Pt states that she only smokes at work not any where else.   Vaping Use  . Vaping Use: Never used  Substance and Sexual Activity  . Alcohol use: No  . Drug use: No  . Sexual activity: Yes    Birth control/protection: Surgical    Comment: hyst

## 2020-08-18 ENCOUNTER — Ambulatory Visit: Payer: PRIVATE HEALTH INSURANCE | Admitting: Orthopaedic Surgery

## 2020-08-19 ENCOUNTER — Ambulatory Visit: Payer: PRIVATE HEALTH INSURANCE | Admitting: Orthopaedic Surgery

## 2020-08-20 ENCOUNTER — Other Ambulatory Visit: Payer: Self-pay

## 2020-08-20 ENCOUNTER — Ambulatory Visit (INDEPENDENT_AMBULATORY_CARE_PROVIDER_SITE_OTHER): Payer: PRIVATE HEALTH INSURANCE | Admitting: Orthopaedic Surgery

## 2020-08-20 DIAGNOSIS — M1711 Unilateral primary osteoarthritis, right knee: Secondary | ICD-10-CM

## 2020-08-21 DIAGNOSIS — M1711 Unilateral primary osteoarthritis, right knee: Secondary | ICD-10-CM | POA: Diagnosis not present

## 2020-08-21 MED ORDER — HYALURONAN 30 MG/2ML IX SOSY
30.0000 mg | PREFILLED_SYRINGE | INTRA_ARTICULAR | Status: AC | PRN
  Administered 2020-08-21: 30 mg via INTRA_ARTICULAR

## 2020-08-21 MED ORDER — LIDOCAINE HCL 1 % IJ SOLN
0.5000 mL | INTRAMUSCULAR | Status: AC | PRN
  Administered 2020-08-21: .5 mL

## 2020-08-21 NOTE — Progress Notes (Signed)
Office Visit Note   Patient: Lisa Fernandez           Date of Birth: 1955/07/25           MRN: [id number] Visit Date: 08/20/2020              Requested by: Caryl Bis, MD [place],  [place] [address] PCP: Caryl Bis, MD   Assessment & Plan: Visit Diagnoses:  1. Unilateral primary osteoarthritis, right knee     Plan: 2nd Orthovisc injection done. Right knee. ROV one week.   Follow-Up Instructions: No follow-ups on file.   Orders:  No orders of the defined types were placed in this encounter.  No orders of the defined types were placed in this encounter.     Procedures: Large Joint Inj: R knee on 08/21/2020 11:57 AM Indications: pain and joint swelling Details: 22 G 1.5 in needle, anterolateral approach  Arthrogram: No  Medications: 0.5 mL lidocaine 1 %; 30 mg Hyaluronan 30 MG/2ML Outcome: tolerated well, no immediate complications Procedure, treatment alternatives, risks and benefits explained, specific risks discussed. Consent was given by the patient. Immediately prior to procedure a time out was called to verify the correct patient, procedure, equipment, support staff and site/side marked as required. Patient was prepped and draped in the usual sterile fashion.       Clinical Data: No additional findings.   Subjective: Chief Complaint  Patient presents with  . Right Knee - Pain    Orthovisc#2    HPI patient had first Orthovisc injection right knee last week with Dr. Durward Fortes.  She could not make her Wednesday appointment and is here on a Thursday for second injection which we performed today.  Review of Systems unchanged   Objective: Vital Signs: There were no vitals taken for this visit.  Physical Exam unchanged.  Ortho Exam no reaction right knee for the first injection last week.  Specialty Comments:  No specialty comments available.  Imaging: No results found.   PMFS History: Patient Active Problem List   Diagnosis Date  Noted  . Unilateral primary osteoarthritis, right knee 05/06/2020  . Anal fissure   . Fecal occult blood test positive 05/31/2017  . Hypothyroidism 05/31/2017  . Annual physical exam 05/25/2015  . Anxiety 05/25/2015   Past Medical History:  Diagnosis Date  . Anxiety   . Chicken pox   . Complication of anesthesia    Blood pressure drops after surgery  . Hypothyroid   . Measles   . Migraines   . PONV (postoperative nausea and vomiting)     Family History  Problem Relation Age of Onset  . Cancer Mother        bladder  . Emphysema Father   . COPD Father   . Heart attack Father   . Diabetes Sister   . Diabetes Maternal Uncle   . Cancer Paternal Aunt   . Cancer Paternal Uncle   . Heart disease Maternal Grandmother   . Diabetes Maternal Grandmother   . Heart disease Maternal Grandfather   . Kidney disease Paternal Grandmother   . Cancer Paternal Grandfather        brain   . Heart attack Paternal Uncle     Past Surgical History:  Procedure Laterality Date  . ABDOMINAL HYSTERECTOMY    . CESAREAN SECTION     3  . FISSURECTOMY N/A 03/11/2019   Procedure: FISSURECTOMY;  Surgeon: Aviva Signs, MD;  Location: [facility];  Service: General;  Laterality: N/A;  . TONSILLECTOMY     Social History   Occupational History  . Not on file  Tobacco Use  . Smoking status: Former Smoker    Packs/day: 0.25    Years: 2.00    Pack years: 0.50    Types: Cigarettes  . Smokeless tobacco: Never Used  . Tobacco comment: Pt states that she only smokes at work not any where else.   Vaping Use  . Vaping Use: Never used  Substance and Sexual Activity  . Alcohol use: No  . Drug use: No  . Sexual activity: Yes    Birth control/protection: Surgical    Comment: hyst

## 2020-08-25 ENCOUNTER — Ambulatory Visit: Payer: PRIVATE HEALTH INSURANCE | Admitting: Orthopaedic Surgery

## 2020-08-26 ENCOUNTER — Other Ambulatory Visit: Payer: Self-pay

## 2020-08-26 ENCOUNTER — Other Ambulatory Visit (HOSPITAL_COMMUNITY): Payer: Self-pay | Admitting: Adult Health

## 2020-08-26 ENCOUNTER — Ambulatory Visit (INDEPENDENT_AMBULATORY_CARE_PROVIDER_SITE_OTHER): Payer: PRIVATE HEALTH INSURANCE | Admitting: Orthopaedic Surgery

## 2020-08-26 ENCOUNTER — Encounter: Payer: Self-pay | Admitting: Orthopaedic Surgery

## 2020-08-26 VITALS — Ht 67.0 in | Wt 151.0 lb

## 2020-08-26 DIAGNOSIS — M1711 Unilateral primary osteoarthritis, right knee: Secondary | ICD-10-CM | POA: Diagnosis not present

## 2020-08-26 DIAGNOSIS — Z1231 Encounter for screening mammogram for malignant neoplasm of breast: Secondary | ICD-10-CM

## 2020-08-26 MED ORDER — HYALURONAN 30 MG/2ML IX SOSY
30.0000 mg | PREFILLED_SYRINGE | INTRA_ARTICULAR | Status: AC | PRN
  Administered 2020-08-26: 30 mg via INTRA_ARTICULAR

## 2020-08-26 NOTE — Progress Notes (Signed)
Office Visit Note   Patient: Lisa Fernandez           Date of Birth: [birth year]           MRN: [id number] Visit Date: 08/26/2020              Requested by: Caryl Bis, MD [place],   [address] PCP: Caryl Bis, MD   Assessment & Plan: Visit Diagnoses:  1. Unilateral primary osteoarthritis, right knee     Plan: Third and final Orthovisc injection right knee.  Has done very well and happy with the results of the prior injections  Follow-Up Instructions: Return if symptoms worsen or fail to improve.   Orders:  Orders Placed This Encounter  Procedures  . Large Joint Inj: R knee   No orders of the defined types were placed in this encounter.     Procedures: Large Joint Inj: R knee on 08/26/2020 9:03 AM Indications: pain and joint swelling Details: 25 G 1.5 in needle, anteromedial approach  Arthrogram: No  Medications: 30 mg Hyaluronan 30 MG/2ML Outcome: tolerated well, no immediate complications Procedure, treatment alternatives, risks and benefits explained, specific risks discussed. Consent was given by the patient. Immediately prior to procedure a time out was called to verify the correct patient, procedure, equipment, support staff and site/side marked as required. Patient was prepped and draped in the usual sterile fashion.       Clinical Data: No additional findings.   Subjective: Chief Complaint  Patient presents with  . Right Knee - Follow-up    Orthovisc started 08/12/2020  Patient presents today for the third and final Orthovisc injection to her right knee. She started the series on 08/12/2020. She is doing well and has noticed improvement.  HPI  Review of Systems   Objective: Vital Signs: Ht 5\' 7"  (1.702 m)   Wt 151 lb (68.5 kg)   BMI 23.65 kg/m   Physical Exam  Ortho Exam right knee was not hot red warm or swollen.  Very minimal tenderness in.  Range of motion 0 to about 120 without instability.  No effusion  Specialty  Comments:  No specialty comments available.  Imaging: No results found.   PMFS History: Patient Active Problem List   Diagnosis Date Noted  . Unilateral primary osteoarthritis, right knee 05/06/2020  . Anal fissure   . Fecal occult blood test positive 05/31/2017  . Hypothyroidism 05/31/2017  . Annual physical exam 05/25/2015  . Anxiety 05/25/2015   Past Medical History:  Diagnosis Date  . Anxiety   . Chicken pox   . Complication of anesthesia    Blood pressure drops after surgery  . Hypothyroid   . Measles   . Migraines   . PONV (postoperative nausea and vomiting)     Family History  Problem Relation Age of Onset  . Cancer Mother        bladder  . Emphysema Father   . COPD Father   . Heart attack Father   . Diabetes Sister   . Diabetes Maternal Uncle   . Cancer Paternal Aunt   . Cancer Paternal Uncle   . Heart disease Maternal Grandmother   . Diabetes Maternal Grandmother   . Heart disease Maternal Grandfather   . Kidney disease Paternal Grandmother   . Cancer Paternal Grandfather        brain   . Heart attack Paternal Uncle     Past Surgical History:  Procedure Laterality Date  .  ABDOMINAL HYSTERECTOMY    . CESAREAN SECTION     3  . FISSURECTOMY N/A 03/11/2019   Procedure: FISSURECTOMY;  Surgeon: Aviva Signs, MD;  Location: [facility];  Service: General;  Laterality: N/A;  . TONSILLECTOMY     Social History   Occupational History  . Not on file  Tobacco Use  . Smoking status: Former Smoker    Packs/day: 0.25    Years: 2.00    Pack years: 0.50    Types: Cigarettes  . Smokeless tobacco: Never Used  . Tobacco comment: Pt states that she only smokes at work not any where else.   Vaping Use  . Vaping Use: Never used  Substance and Sexual Activity  . Alcohol use: No  . Drug use: No  . Sexual activity: Yes    Birth control/protection: Surgical    Comment: hyst

## 2020-09-07 ENCOUNTER — Other Ambulatory Visit: Payer: Self-pay

## 2020-09-07 ENCOUNTER — Ambulatory Visit (HOSPITAL_COMMUNITY)
Admission: RE | Admit: 2020-09-07 | Discharge: 2020-09-07 | Disposition: A | Discharge status: Home / Self Care | Payer: PRIVATE HEALTH INSURANCE | Source: Ambulatory Visit | Attending: Adult Health | Admitting: Adult Health

## 2020-09-07 DIAGNOSIS — Z1231 Encounter for screening mammogram for malignant neoplasm of breast: Secondary | ICD-10-CM | POA: Insufficient documentation

## 2020-09-10 ENCOUNTER — Other Ambulatory Visit (HOSPITAL_COMMUNITY): Payer: Self-pay | Admitting: Adult Health

## 2020-09-10 DIAGNOSIS — R928 Other abnormal and inconclusive findings on diagnostic imaging of breast: Secondary | ICD-10-CM

## 2020-09-15 ENCOUNTER — Ambulatory Visit (HOSPITAL_COMMUNITY)
Admission: RE | Admit: 2020-09-15 | Discharge: 2020-09-15 | Disposition: A | Discharge status: Home / Self Care | Payer: PRIVATE HEALTH INSURANCE | Source: Ambulatory Visit | Attending: Adult Health | Admitting: Adult Health

## 2020-09-15 ENCOUNTER — Other Ambulatory Visit: Payer: Self-pay

## 2020-09-15 DIAGNOSIS — R928 Other abnormal and inconclusive findings on diagnostic imaging of breast: Secondary | ICD-10-CM

## 2021-06-11 ENCOUNTER — Other Ambulatory Visit: Payer: Self-pay | Admitting: Family Medicine

## 2021-06-11 ENCOUNTER — Other Ambulatory Visit (HOSPITAL_COMMUNITY): Payer: Self-pay | Admitting: Family Medicine

## 2021-06-11 DIAGNOSIS — D1722 Benign lipomatous neoplasm of skin and subcutaneous tissue of left arm: Secondary | ICD-10-CM

## 2021-06-24 ENCOUNTER — Ambulatory Visit (HOSPITAL_COMMUNITY)
Admission: RE | Admit: 2021-06-24 | Discharge: 2021-06-24 | Disposition: A | Discharge status: Home / Self Care | Payer: 59 | Source: Ambulatory Visit | Attending: Family Medicine | Admitting: Family Medicine

## 2021-06-24 ENCOUNTER — Other Ambulatory Visit: Payer: Self-pay

## 2021-06-24 DIAGNOSIS — D1722 Benign lipomatous neoplasm of skin and subcutaneous tissue of left arm: Secondary | ICD-10-CM

## 2021-10-07 ENCOUNTER — Other Ambulatory Visit (HOSPITAL_COMMUNITY): Payer: Self-pay | Admitting: Adult Health

## 2021-10-07 DIAGNOSIS — Z1231 Encounter for screening mammogram for malignant neoplasm of breast: Secondary | ICD-10-CM

## 2021-10-20 ENCOUNTER — Ambulatory Visit (HOSPITAL_COMMUNITY)
Admission: RE | Admit: 2021-10-20 | Discharge: 2021-10-20 | Disposition: A | Discharge status: Home / Self Care | Payer: 59 | Source: Ambulatory Visit | Attending: Adult Health | Admitting: Adult Health

## 2021-10-20 DIAGNOSIS — Z1231 Encounter for screening mammogram for malignant neoplasm of breast: Secondary | ICD-10-CM | POA: Diagnosis not present

## 2022-03-09 DIAGNOSIS — Z6824 Body mass index (BMI) 24.0-24.9, adult: Secondary | ICD-10-CM | POA: Diagnosis not present

## 2022-03-09 DIAGNOSIS — E039 Hypothyroidism, unspecified: Secondary | ICD-10-CM | POA: Diagnosis not present

## 2022-03-09 DIAGNOSIS — F988 Other specified behavioral and emotional disorders with onset usually occurring in childhood and adolescence: Secondary | ICD-10-CM | POA: Diagnosis not present

## 2022-03-09 DIAGNOSIS — F5101 Primary insomnia: Secondary | ICD-10-CM | POA: Diagnosis not present

## 2022-03-09 DIAGNOSIS — R030 Elevated blood-pressure reading, without diagnosis of hypertension: Secondary | ICD-10-CM | POA: Diagnosis not present

## 2022-03-09 DIAGNOSIS — E7849 Other hyperlipidemia: Secondary | ICD-10-CM | POA: Diagnosis not present

## 2022-03-09 DIAGNOSIS — Z23 Encounter for immunization: Secondary | ICD-10-CM | POA: Diagnosis not present

## 2022-03-09 DIAGNOSIS — R69 Illness, unspecified: Secondary | ICD-10-CM | POA: Diagnosis not present

## 2022-06-07 DIAGNOSIS — R030 Elevated blood-pressure reading, without diagnosis of hypertension: Secondary | ICD-10-CM | POA: Diagnosis not present

## 2022-06-07 DIAGNOSIS — Z20828 Contact with and (suspected) exposure to other viral communicable diseases: Secondary | ICD-10-CM | POA: Diagnosis not present

## 2022-06-07 DIAGNOSIS — J40 Bronchitis, not specified as acute or chronic: Secondary | ICD-10-CM | POA: Diagnosis not present

## 2022-06-07 DIAGNOSIS — Z6823 Body mass index (BMI) 23.0-23.9, adult: Secondary | ICD-10-CM | POA: Diagnosis not present

## 2022-06-09 DIAGNOSIS — G43909 Migraine, unspecified, not intractable, without status migrainosus: Secondary | ICD-10-CM | POA: Diagnosis not present

## 2022-06-09 DIAGNOSIS — Z6823 Body mass index (BMI) 23.0-23.9, adult: Secondary | ICD-10-CM | POA: Diagnosis not present

## 2022-06-09 DIAGNOSIS — R030 Elevated blood-pressure reading, without diagnosis of hypertension: Secondary | ICD-10-CM | POA: Diagnosis not present

## 2022-06-09 DIAGNOSIS — Z0000 Encounter for general adult medical examination without abnormal findings: Secondary | ICD-10-CM | POA: Diagnosis not present

## 2022-06-09 DIAGNOSIS — E039 Hypothyroidism, unspecified: Secondary | ICD-10-CM | POA: Diagnosis not present

## 2022-06-09 DIAGNOSIS — R69 Illness, unspecified: Secondary | ICD-10-CM | POA: Diagnosis not present

## 2022-07-15 DIAGNOSIS — Z20828 Contact with and (suspected) exposure to other viral communicable diseases: Secondary | ICD-10-CM | POA: Diagnosis not present

## 2022-08-04 ENCOUNTER — Encounter: Payer: Self-pay | Admitting: Radiology

## 2022-09-01 DIAGNOSIS — E039 Hypothyroidism, unspecified: Secondary | ICD-10-CM | POA: Diagnosis not present

## 2022-09-01 DIAGNOSIS — G43909 Migraine, unspecified, not intractable, without status migrainosus: Secondary | ICD-10-CM | POA: Diagnosis not present

## 2022-09-01 DIAGNOSIS — F988 Other specified behavioral and emotional disorders with onset usually occurring in childhood and adolescence: Secondary | ICD-10-CM | POA: Diagnosis not present

## 2022-09-01 DIAGNOSIS — Z6824 Body mass index (BMI) 24.0-24.9, adult: Secondary | ICD-10-CM | POA: Diagnosis not present

## 2022-09-01 DIAGNOSIS — R030 Elevated blood-pressure reading, without diagnosis of hypertension: Secondary | ICD-10-CM | POA: Diagnosis not present

## 2022-10-12 ENCOUNTER — Other Ambulatory Visit (HOSPITAL_COMMUNITY): Payer: Self-pay | Admitting: Adult Health

## 2022-10-12 DIAGNOSIS — Z1231 Encounter for screening mammogram for malignant neoplasm of breast: Secondary | ICD-10-CM

## 2022-10-24 ENCOUNTER — Inpatient Hospital Stay (HOSPITAL_COMMUNITY): Admission: RE | Admit: 2022-10-24 | Payer: Self-pay | Source: Ambulatory Visit

## 2022-10-28 ENCOUNTER — Ambulatory Visit (HOSPITAL_COMMUNITY)
Admission: RE | Admit: 2022-10-28 | Discharge: 2022-10-28 | Disposition: A | Discharge status: Home / Self Care | Payer: 59 | Source: Ambulatory Visit | Attending: Adult Health | Admitting: Adult Health

## 2022-10-28 ENCOUNTER — Encounter (HOSPITAL_COMMUNITY): Payer: Self-pay

## 2022-10-28 DIAGNOSIS — Z1231 Encounter for screening mammogram for malignant neoplasm of breast: Secondary | ICD-10-CM | POA: Diagnosis not present

## 2022-12-01 DIAGNOSIS — E039 Hypothyroidism, unspecified: Secondary | ICD-10-CM | POA: Diagnosis not present

## 2022-12-01 DIAGNOSIS — F988 Other specified behavioral and emotional disorders with onset usually occurring in childhood and adolescence: Secondary | ICD-10-CM | POA: Diagnosis not present

## 2022-12-01 DIAGNOSIS — Z6824 Body mass index (BMI) 24.0-24.9, adult: Secondary | ICD-10-CM | POA: Diagnosis not present

## 2022-12-01 DIAGNOSIS — G43909 Migraine, unspecified, not intractable, without status migrainosus: Secondary | ICD-10-CM | POA: Diagnosis not present

## 2022-12-01 DIAGNOSIS — R030 Elevated blood-pressure reading, without diagnosis of hypertension: Secondary | ICD-10-CM | POA: Diagnosis not present

## 2023-03-03 DIAGNOSIS — E039 Hypothyroidism, unspecified: Secondary | ICD-10-CM | POA: Diagnosis not present

## 2023-03-03 DIAGNOSIS — Z6824 Body mass index (BMI) 24.0-24.9, adult: Secondary | ICD-10-CM | POA: Diagnosis not present

## 2023-03-03 DIAGNOSIS — F988 Other specified behavioral and emotional disorders with onset usually occurring in childhood and adolescence: Secondary | ICD-10-CM | POA: Diagnosis not present

## 2023-03-03 DIAGNOSIS — R030 Elevated blood-pressure reading, without diagnosis of hypertension: Secondary | ICD-10-CM | POA: Diagnosis not present

## 2023-03-03 DIAGNOSIS — G43909 Migraine, unspecified, not intractable, without status migrainosus: Secondary | ICD-10-CM | POA: Diagnosis not present

## 2023-03-03 DIAGNOSIS — M79642 Pain in left hand: Secondary | ICD-10-CM | POA: Diagnosis not present

## 2023-03-03 DIAGNOSIS — M25551 Pain in right hip: Secondary | ICD-10-CM | POA: Diagnosis not present

## 2023-03-03 DIAGNOSIS — M79641 Pain in right hand: Secondary | ICD-10-CM | POA: Diagnosis not present

## 2023-06-09 DIAGNOSIS — F988 Other specified behavioral and emotional disorders with onset usually occurring in childhood and adolescence: Secondary | ICD-10-CM | POA: Diagnosis not present

## 2023-06-09 DIAGNOSIS — F411 Generalized anxiety disorder: Secondary | ICD-10-CM | POA: Diagnosis not present

## 2023-06-09 DIAGNOSIS — Z0000 Encounter for general adult medical examination without abnormal findings: Secondary | ICD-10-CM | POA: Diagnosis not present

## 2023-06-09 DIAGNOSIS — Z23 Encounter for immunization: Secondary | ICD-10-CM | POA: Diagnosis not present

## 2023-06-09 DIAGNOSIS — Z6823 Body mass index (BMI) 23.0-23.9, adult: Secondary | ICD-10-CM | POA: Diagnosis not present

## 2023-06-09 DIAGNOSIS — R030 Elevated blood-pressure reading, without diagnosis of hypertension: Secondary | ICD-10-CM | POA: Diagnosis not present

## 2023-06-09 DIAGNOSIS — G43909 Migraine, unspecified, not intractable, without status migrainosus: Secondary | ICD-10-CM | POA: Diagnosis not present

## 2023-06-09 DIAGNOSIS — E039 Hypothyroidism, unspecified: Secondary | ICD-10-CM | POA: Diagnosis not present

## 2023-06-09 DIAGNOSIS — G4700 Insomnia, unspecified: Secondary | ICD-10-CM | POA: Diagnosis not present

## 2023-07-28 ENCOUNTER — Ambulatory Visit
Admission: RE | Admit: 2023-07-28 | Discharge: 2023-07-28 | Disposition: A | Discharge status: Home / Self Care | Payer: Medicare HMO | Source: Ambulatory Visit | Attending: Family Medicine | Admitting: Family Medicine

## 2023-07-28 ENCOUNTER — Other Ambulatory Visit: Payer: Self-pay | Admitting: Family Medicine

## 2023-07-28 DIAGNOSIS — R590 Localized enlarged lymph nodes: Secondary | ICD-10-CM

## 2023-07-28 DIAGNOSIS — R599 Enlarged lymph nodes, unspecified: Secondary | ICD-10-CM | POA: Diagnosis not present

## 2023-08-21 IMAGING — US US AXILLARY LEFT
1 series · 7 of 7 positions shown · non-contrast
Comparison: None

CLINICAL DATA: LEFT axillary mass question lipoma

EXAM:
ULTRASOUND OF LEFT AXILLARY SOFT TISSUES
TECHNIQUE: Ultrasound examination of the axillary soft tissues was performed in
the area of clinical concern.

[Series 1: us axilla left · 7 acquisitions, 7 frames shown]
[im 1/7]
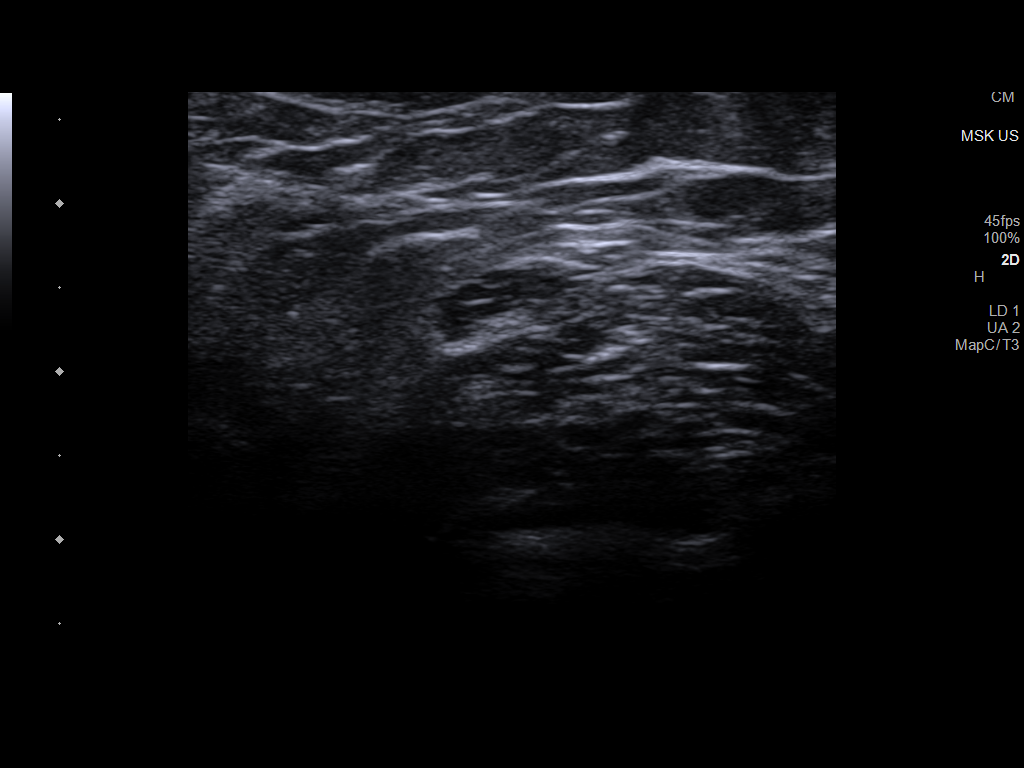
[im 2/7]
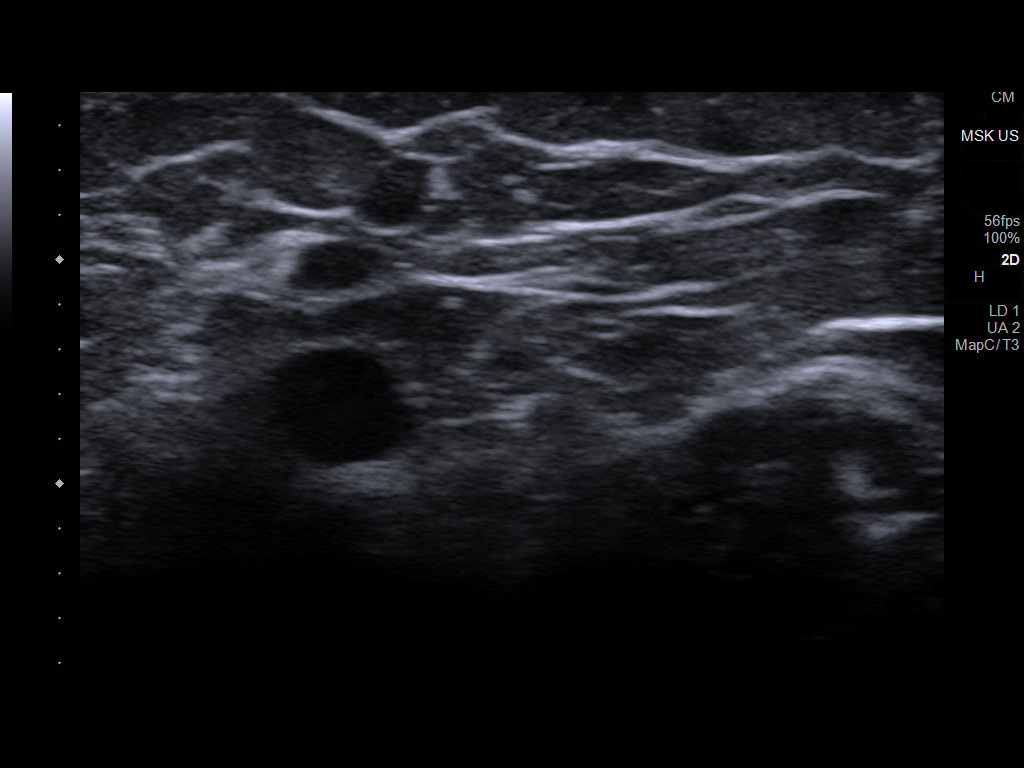
[im 3/7]
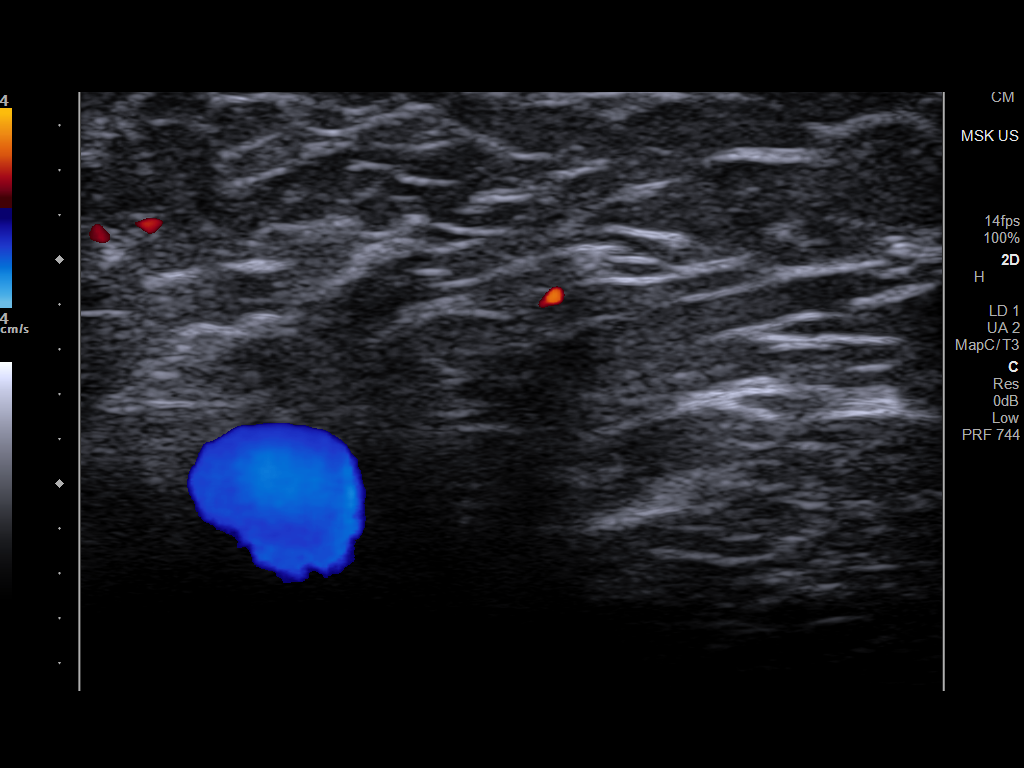
[im 4/7]
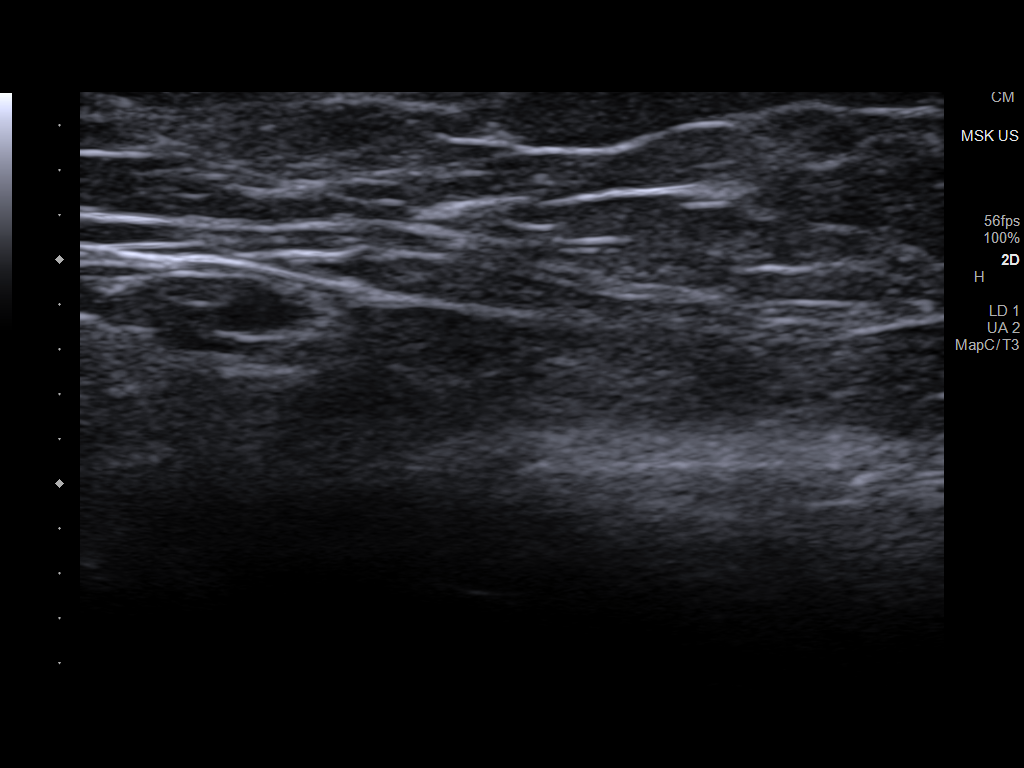
[im 5/7]
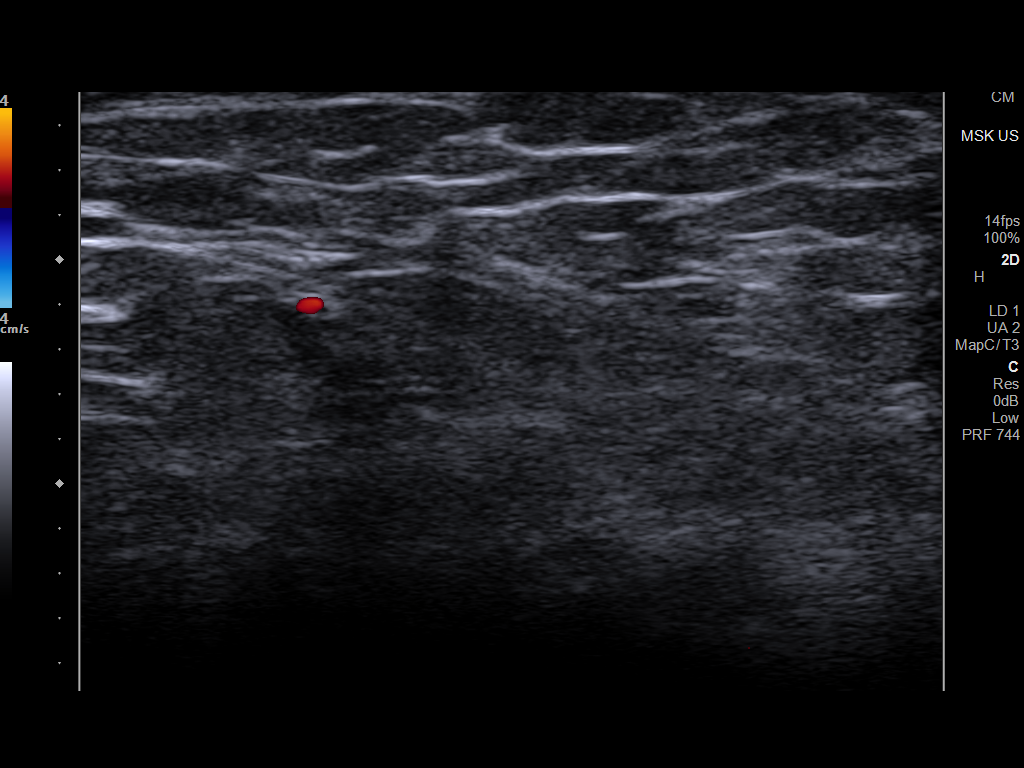
[im 6/7]
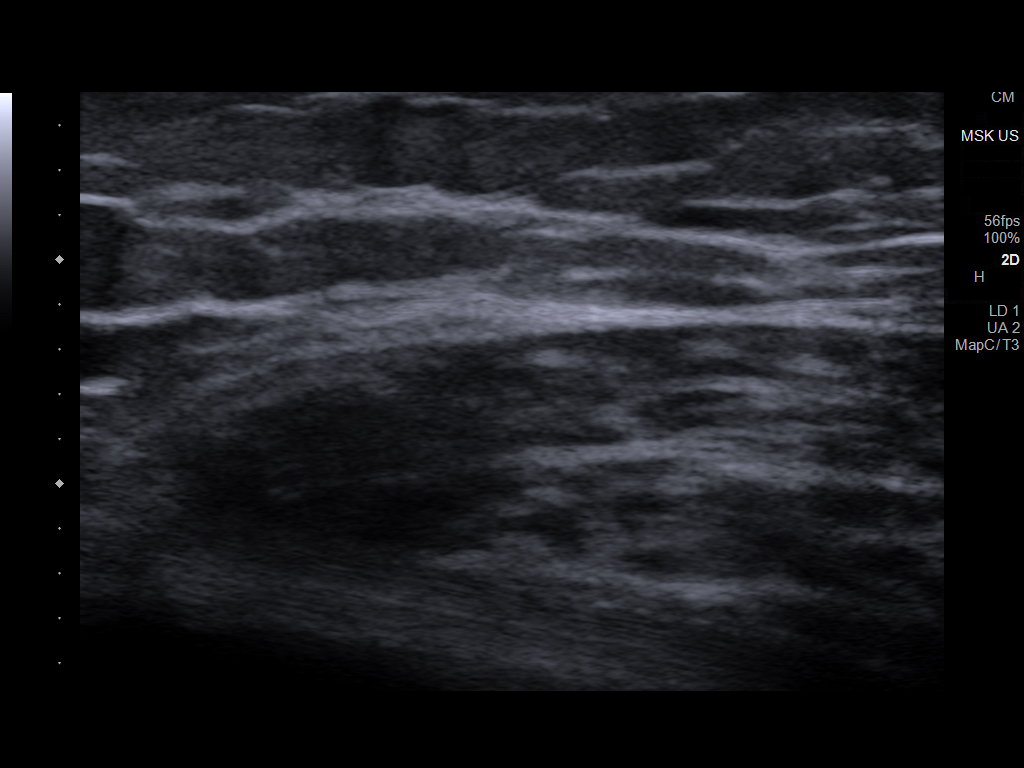
[im 7/7]
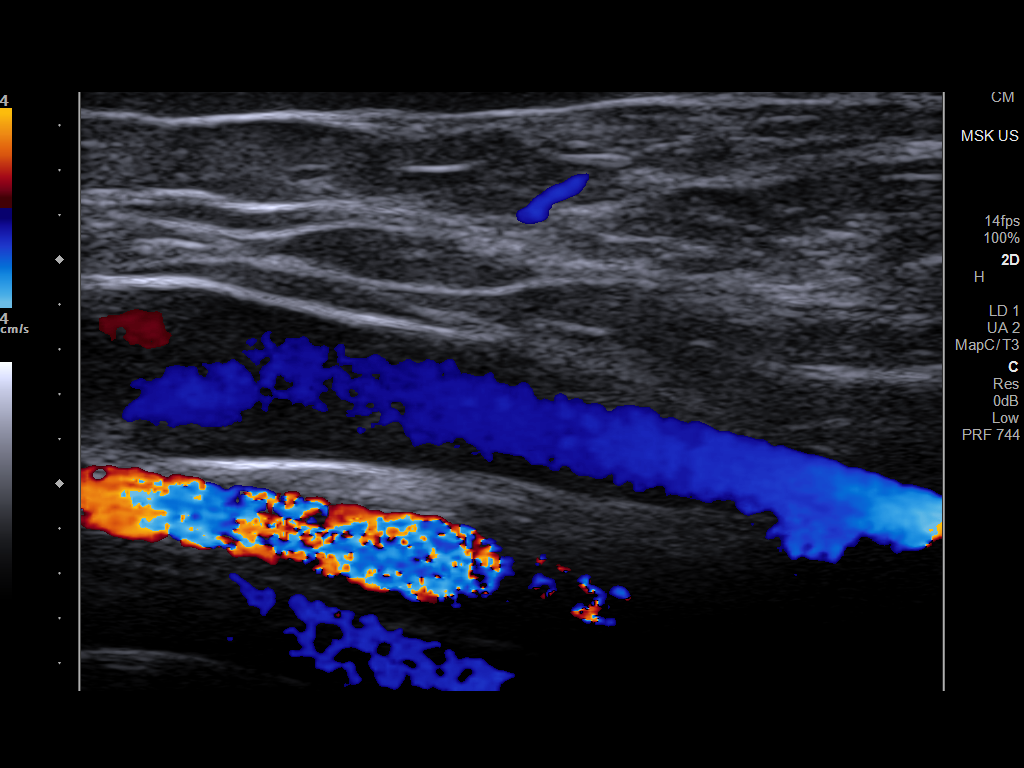

[7 of 7 positions shown; findings below may reference images not displayed]

FINDINGS: Sonography of the LEFT axilla fails to identify and LEFT axillary
mass.

No solid mass, adenopathy, lipoma, or cystic collection identified.

No soft tissue edema seen.
IMPRESSION: Negative ultrasound of the LEFT axilla.

Clinical management of the site is recommended.

This exam does not constitute a workup for breast cancer;
correlation with breast physical exam and routine mammography
recommended.

If patient has a palpable finding in the breast or other breast
symptoms, diagnostic mammography recommended.

## 2023-09-07 DIAGNOSIS — F988 Other specified behavioral and emotional disorders with onset usually occurring in childhood and adolescence: Secondary | ICD-10-CM | POA: Diagnosis not present

## 2023-09-07 DIAGNOSIS — Z6824 Body mass index (BMI) 24.0-24.9, adult: Secondary | ICD-10-CM | POA: Diagnosis not present

## 2023-09-07 DIAGNOSIS — F411 Generalized anxiety disorder: Secondary | ICD-10-CM | POA: Diagnosis not present

## 2023-09-07 DIAGNOSIS — E039 Hypothyroidism, unspecified: Secondary | ICD-10-CM | POA: Diagnosis not present

## 2023-09-25 DIAGNOSIS — Z860100 Personal history of colon polyps, unspecified: Secondary | ICD-10-CM | POA: Diagnosis not present

## 2023-09-25 DIAGNOSIS — Z09 Encounter for follow-up examination after completed treatment for conditions other than malignant neoplasm: Secondary | ICD-10-CM | POA: Diagnosis not present

## 2023-10-05 ENCOUNTER — Other Ambulatory Visit (HOSPITAL_COMMUNITY): Payer: Self-pay | Admitting: Family Medicine

## 2023-10-05 DIAGNOSIS — Z1231 Encounter for screening mammogram for malignant neoplasm of breast: Secondary | ICD-10-CM

## 2023-11-06 ENCOUNTER — Ambulatory Visit (HOSPITAL_COMMUNITY)
Admission: RE | Admit: 2023-11-06 | Discharge: 2023-11-06 | Disposition: A | Discharge status: Home / Self Care | Source: Ambulatory Visit | Attending: Family Medicine | Admitting: Family Medicine

## 2023-11-06 DIAGNOSIS — Z1231 Encounter for screening mammogram for malignant neoplasm of breast: Secondary | ICD-10-CM | POA: Diagnosis not present

## 2023-11-10 ENCOUNTER — Encounter (HOSPITAL_COMMUNITY): Payer: Self-pay | Admitting: Family Medicine

## 2023-11-13 ENCOUNTER — Other Ambulatory Visit (HOSPITAL_COMMUNITY): Payer: Self-pay | Admitting: Family Medicine

## 2023-11-13 DIAGNOSIS — R928 Other abnormal and inconclusive findings on diagnostic imaging of breast: Secondary | ICD-10-CM

## 2023-12-12 DIAGNOSIS — F411 Generalized anxiety disorder: Secondary | ICD-10-CM | POA: Diagnosis not present

## 2023-12-12 DIAGNOSIS — E039 Hypothyroidism, unspecified: Secondary | ICD-10-CM | POA: Diagnosis not present

## 2023-12-12 DIAGNOSIS — Z6824 Body mass index (BMI) 24.0-24.9, adult: Secondary | ICD-10-CM | POA: Diagnosis not present

## 2023-12-12 DIAGNOSIS — F988 Other specified behavioral and emotional disorders with onset usually occurring in childhood and adolescence: Secondary | ICD-10-CM | POA: Diagnosis not present

## 2023-12-19 ENCOUNTER — Ambulatory Visit (HOSPITAL_COMMUNITY)
Admission: RE | Admit: 2023-12-19 | Discharge: 2023-12-19 | Disposition: A | Discharge status: Home / Self Care | Source: Ambulatory Visit | Attending: Family Medicine | Admitting: Family Medicine

## 2023-12-19 ENCOUNTER — Encounter (HOSPITAL_COMMUNITY): Payer: Self-pay

## 2023-12-19 DIAGNOSIS — R92322 Mammographic fibroglandular density, left breast: Secondary | ICD-10-CM | POA: Diagnosis not present

## 2023-12-19 DIAGNOSIS — R928 Other abnormal and inconclusive findings on diagnostic imaging of breast: Secondary | ICD-10-CM | POA: Insufficient documentation

## 2024-01-02 DIAGNOSIS — S0592XA Unspecified injury of left eye and orbit, initial encounter: Secondary | ICD-10-CM | POA: Diagnosis not present

## 2024-01-02 DIAGNOSIS — R030 Elevated blood-pressure reading, without diagnosis of hypertension: Secondary | ICD-10-CM | POA: Diagnosis not present

## 2024-03-08 DIAGNOSIS — E039 Hypothyroidism, unspecified: Secondary | ICD-10-CM | POA: Diagnosis not present

## 2024-03-08 DIAGNOSIS — F411 Generalized anxiety disorder: Secondary | ICD-10-CM | POA: Diagnosis not present

## 2024-03-08 DIAGNOSIS — Z6825 Body mass index (BMI) 25.0-25.9, adult: Secondary | ICD-10-CM | POA: Diagnosis not present

## 2024-03-08 DIAGNOSIS — F988 Other specified behavioral and emotional disorders with onset usually occurring in childhood and adolescence: Secondary | ICD-10-CM | POA: Diagnosis not present

## 2024-03-08 DIAGNOSIS — N952 Postmenopausal atrophic vaginitis: Secondary | ICD-10-CM | POA: Diagnosis not present

## 2024-03-08 DIAGNOSIS — Z23 Encounter for immunization: Secondary | ICD-10-CM | POA: Diagnosis not present

## 2024-04-08 DIAGNOSIS — H5203 Hypermetropia, bilateral: Secondary | ICD-10-CM | POA: Diagnosis not present

## 2024-04-08 DIAGNOSIS — H524 Presbyopia: Secondary | ICD-10-CM | POA: Diagnosis not present

## 2024-04-08 DIAGNOSIS — H52223 Regular astigmatism, bilateral: Secondary | ICD-10-CM | POA: Diagnosis not present
